# Patient Record
Sex: Female | Born: 1955 | Race: Asian | Hispanic: No | Marital: Married | Smoking: Never smoker
Health system: Southern US, Community
[De-identification: ages and names within clinical notes are randomized; demographics above are authoritative.]

## PROBLEM LIST (undated history)

## (undated) DIAGNOSIS — K219 Gastro-esophageal reflux disease without esophagitis: Secondary | ICD-10-CM

## (undated) DIAGNOSIS — E785 Hyperlipidemia, unspecified: Secondary | ICD-10-CM

---

## 1996-12-09 HISTORY — PX: APPENDECTOMY: SHX54

## 2009-05-10 ENCOUNTER — Ambulatory Visit: Payer: Self-pay | Admitting: Diagnostic Radiology

## 2009-05-10 ENCOUNTER — Emergency Department (HOSPITAL_BASED_OUTPATIENT_CLINIC_OR_DEPARTMENT_OTHER): Admission: EM | Admit: 2009-05-10 | Discharge: 2009-05-10 | Payer: Self-pay | Admitting: Emergency Medicine

## 2011-03-18 LAB — URINALYSIS, ROUTINE W REFLEX MICROSCOPIC
Glucose, UA: NEGATIVE mg/dL
Protein, ur: NEGATIVE mg/dL
Specific Gravity, Urine: 1.005 (ref 1.005–1.030)
pH: 7 (ref 5.0–8.0)

## 2011-03-18 LAB — URINE MICROSCOPIC-ADD ON

## 2011-03-18 LAB — CBC
Hemoglobin: 14.6 g/dL (ref 12.0–15.0)
MCHC: 34.8 g/dL (ref 30.0–36.0)
RDW: 11.8 % (ref 11.5–15.5)

## 2011-03-18 LAB — COMPREHENSIVE METABOLIC PANEL
ALT: 11 U/L (ref 0–35)
Calcium: 9.7 mg/dL (ref 8.4–10.5)
GFR calc Af Amer: >60 mL/min (ref >60)
Glucose, Bld: 101 mg/dL — ABNORMAL HIGH (ref 70–99)
Sodium: 146 mEq/L — ABNORMAL HIGH (ref 135–145)
Total Protein: 9 g/dL — ABNORMAL HIGH (ref 6.0–8.3)

## 2011-03-18 LAB — DIFFERENTIAL
Eosinophils Absolute: 0.1 10*3/uL (ref 0.0–0.7)
Lymphocytes Relative: 39 % (ref 12–46)
Lymphs Abs: 1.8 10*3/uL (ref 0.7–4.0)
Monocytes Relative: 5 % (ref 3–12)
Neutrophils Relative %: 54 % (ref 43–77)

## 2011-03-18 LAB — POCT CARDIAC MARKERS
CKMB, poc: <1 ng/mL — ABNORMAL LOW (ref 1.0–8.0)
Troponin i, poc: <0.05 ng/mL (ref 0.00–0.09)

## 2015-10-11 ENCOUNTER — Ambulatory Visit: Payer: BC Managed Care – PPO | Attending: Physician Assistant

## 2015-10-11 DIAGNOSIS — M25659 Stiffness of unspecified hip, not elsewhere classified: Secondary | ICD-10-CM | POA: Diagnosis present

## 2015-10-11 DIAGNOSIS — M5441 Lumbago with sciatica, right side: Secondary | ICD-10-CM | POA: Insufficient documentation

## 2015-10-11 NOTE — Patient Instructions (Signed)
Perform all exercises below:  Hold _20___ seconds. Repeat _3___ times.  Do __3__ sessions per day. CAUTION: Movement should be gentle, steady and slow.  Knee to Chest  Lying supine, bend involved knee to chest. Perform with each leg.  Copyright  VHI. All rights reserved.  Double Knee to Chest (Flexion)   Gently pull both knees toward chest. Feel stretch in lower back or buttock area. Breathing deeply, Lumbar Rotation: Caudal - Bilateral (Supine)  Feet and knees together, arms outstretched, rotate knees left, turning head in opposite direction, until stretch is felt.      HIP: Hamstrings - Short Sitting   Rest leg on raised surface. Keep knee straight. Lift chest.   [facility] [address], [address] [place], [place] [address] Phone # [phone] Fax [phone] 

## 2015-10-11 NOTE — Therapy (Signed)
[facility] [address] [place], [place], [address] Phone: [phone]   Fax:  [phone]  Physical Therapy Evaluation  Patient Details  Name: Brenda Ortega MRN: [id number] Date of Birth: Jun 27, 1956 Referring Provider: Manon HildingMauney, Jessica PA  Encounter Date: 10/11/2015      PT End of Session - 10/11/15 1610    Visit Number 1   Date for PT Re-Evaluation 12/06/15   PT Start Time 1534   PT Stop Time 1610   PT Time Calculation (min) 36 min   Activity Tolerance Patient tolerated treatment well   Behavior During Therapy [facility] for tasks assessed/performed      History reviewed. No pertinent past medical history.  History reviewed. No pertinent past surgical history.  There were no vitals filed for this visit.  Visit Diagnosis:  Bilateral low back pain with right-sided sciatica - Plan: PT plan of care cert/re-cert  Hip stiffness, unspecified laterality - Plan: PT plan of care cert/re-cert      Subjective Assessment - 10/11/15 1535    Subjective Pt presents to PT with complaints of LBP that began 2009.  Pt stopped working 2011 due to LBP/Rt LE pain.  Pt had injections into lumbar spine due to Rt LE pain.  Pt has not had therapy in the past.  2 months ago, pain began in low back again.     Patient is accompained by: Interpreter   Limitations Sitting;Walking   How long can you sit comfortably? 1 hour   How long can you walk comfortably? 1 hour   Patient Stated Goals reduce low back pain, stand and walk longer, less pain with steps   Currently in Pain? Yes   Pain Score 5    Pain Location Back   Pain Orientation Right   Pain Descriptors / Indicators Stabbing   Pain Type Chronic pain   Pain Radiating Towards Rt LE radiating to Rt heel   Pain Onset More than a month ago   Pain Frequency Constant   Aggravating Factors  standing, sitting, negotiating steps   Pain Relieving Factors medication            [facility] PT  Assessment - 10/11/15 0001    Assessment   Medical Diagnosis LBP (M54.5)   Referring Provider Manon HildingMauney, Jessica PA   Onset Date/Surgical Date 08/11/15  chronic history with flare-up 2 months ago   Next MD Visit unknown   Prior Therapy none   Precautions   Precautions None   Restrictions   Weight Bearing Restrictions No   Balance Screen   Has the patient fallen in the past 6 months No   Has the patient had a decrease in activity level because of a fear of falling?  No   Is the patient reluctant to leave their home because of a fear of falling?  No   Home Environment   Living Environment Private residence   Type of Home House   Home Access Stairs to enter   Entrance Stairs-Number of Steps 3   Home Layout One level   Prior Function   Level of Independence Independent   Vocation Unemployed   Leisure pt has a stationary bike at home   Cognition   Overall Cognitive Status Within Functional Limits for tasks assessed   Observation/Other Assessments   Focus on Therapeutic Outcomes (FOTO)  41% limitation   Posture/Postural Control   Posture/Postural Control Postural limitations   Postural Limitations --  Rt iliac crest higher  ROM / Strength   AROM / PROM / Strength AROM;PROM;Strength   AROM   Overall AROM  Within functional limits for tasks performed   Overall AROM Comments Lumbar AROM is full.  Pt reports Rt LE pain with Rt lumbar sidebending.  Mild lumbar pain with lumbar AROM at end range.   PROM   Overall PROM  Deficits   Overall PROM Comments Hip flexibility limited by 25% in all directions without pain   Strength   Overall Strength Deficits   Overall Strength Comments Rt LE strength 4/5 to 4+/5, Lt LE 4+/5 to 5/5 throughout   Palpation   Spinal mobility reduced spinal mobility with PA glides T10-L4 without pain   Palpation comment Pt with tension and mild palpable tenderness over bilateral lumbar paraspinals.     Special Tests    Special Tests Lumbar   Lumbar Tests  Slump Test;Straight Leg Raise   Slump test   Findings Negative   Side Right   Straight Leg Raise   Findings Negative   Side  Right   Ambulation/Gait   Ambulation/Gait Yes   Ambulation/Gait Assistance 7: Independent                           PT Education - 10/11/15 1601    Education provided Yes   Education Details HEP: lumbar flexibility    Person(s) Educated Patient   Methods Explanation;Demonstration;Tactile cues;Handout   Comprehension Verbalized understanding;Returned demonstration          PT Short Term Goals - 10/11/15 1616    PT SHORT TERM GOAL #1   Title be indepenent in initial HEP   Time 4   Period Weeks   Status New   PT SHORT TERM GOAL #2   Title report a 30% reduction in Rt LE pain and LBP with home tasks   Time 4   Period Weeks   Status New           PT Long Term Goals - 10/11/15 1531    PT LONG TERM GOAL #1   Title be independent in advanced HEP   Time 8   Period Weeks   Status New   PT LONG TERM GOAL #2   Title reduce FOTO to < or = to 29% limitation   Time 8   Period Weeks   Status New   PT LONG TERM GOAL #3   Title report a 60% reduction in LBP and Rt LE pain with home tasks   Time 8   Period Weeks   Status New   PT LONG TERM GOAL #4   Title negotiate steps with 50% less Rt LE pain   Time 8   Period Weeks   Status New               Plan - 10/11/15 1611    Clinical Impression Statement Pt presents to PT with chronic LBP and Rt LE radiculopathy.  Pt reports that she was told in 2009 that she needed lumbar surgery but didn't want to have surgery.  She stopped working in 2011 due to LBP.  Pt reports that LBP increased 2 months ago without cause.  Pt demonstrates limited hip flexibility, Rt LE pain with Rt lumbar sidebending, LE weakness, FOTO score of 41% limitation.  Pt will benefit from skilled PT for hip flexibility, core strength, LE strength, modalities and trial of traction to reduce pain.     Pt will  benefit  from skilled therapeutic intervention in order to improve on the following deficits Postural dysfunction;Decreased strength;Impaired flexibility;Pain;Decreased activity tolerance;Decreased range of motion   Rehab Potential Good   PT Frequency 2x / week   PT Duration 8 weeks   PT Treatment/Interventions ADLs/Self Care Home Management;Cryotherapy;Electrical Stimulation;Moist Heat;Therapeutic exercise;Therapeutic activities;Functional mobility training;Traction;Ultrasound;Neuromuscular re-education;Patient/family education;Manual techniques;Passive range of motion;Taping   PT Next Visit Plan body mechanics, hip flexibility, LE strength, try traction, modalities for pain management   Consulted and Agree with Plan of Care Patient         Problem List There are no active problems to display for this patient.   Rorik Vespa, PT 10/11/2015, 4:19 PM  [facility] [address], [address] [place], [place], [address] Phone: [phone]   Fax:  [phone]  Name: Brenda Ortega MRN: [id number] Date of Birth: 01/26/1956

## 2015-10-18 ENCOUNTER — Ambulatory Visit: Payer: BC Managed Care – PPO | Admitting: Physical Therapy

## 2015-10-18 ENCOUNTER — Encounter: Payer: Self-pay | Admitting: Physical Therapy

## 2015-10-18 DIAGNOSIS — M5441 Lumbago with sciatica, right side: Secondary | ICD-10-CM

## 2015-10-18 DIAGNOSIS — M25659 Stiffness of unspecified hip, not elsewhere classified: Secondary | ICD-10-CM

## 2015-10-18 NOTE — Patient Instructions (Signed)
Lifting Principles  .Maintain proper posture and head alignment. .Slide object as close as possible before lifting. .Move obstacles out of the way. .Test before lifting; ask for help if too heavy. .Tighten stomach muscles without holding breath. .Use smooth movements; do not jerk. .Use legs to do the work, and pivot with feet. .Distribute the work load symmetrically and close to the center of trunk. .Push instead of pull whenever possible.  Copyright  VHI. All rights reserved.  Posture - Sitting    Sit upright, head facing forward. Try using a roll to support lower back. Keep shoulders relaxed, and avoid rounded back. Keep hips level with knees. Avoid crossing legs for long periods.   Copyright  VHI. All rights reserved.  Deep Squat    Squat and lift with both arms held against upper trunk. Tighten stomach muscles without holding breath. Use smooth movements to avoid jerking.  Copyright  VHI. All rights reserved.  Housework - Sweeping    Use long-handled equipment to avoid stooping.   Copyright  VHI. All rights reserved.  Sleeping on Back    Place pillow under knees. A pillow with cervical support and a roll around waist are also helpful.   Copyright  VHI. All rights reserved.  Sleeping on Side    Place pillow between knees. Use cervical support under neck and a roll around waist as needed.   Copyright  VHI. All rights reserved.  Chair Sitting    Sit at edge of seat, spine straight, one leg extended. Put a hand on each thigh and bend forward from the hip, keeping spine straight. Allow hand on extended leg to reach toward toes. Support upper body with other arm. Hold _30__ seconds. Repeat _2__ times per session. Do _1__ sessions per day.  Copyright  VHI. All rights reserved.  Piriformis Stretch, Sitting    Sit, one ankle on opposite knee, same-side hand on crossed knee. Push down on knee, keeping spine straight. Lean torso forward, with flat  back, until tension is felt in hamstrings and gluteals of crossed-leg side. Hold _30__ seconds.  Repeat _1__ times per session. Do _2__ sessions per day.  Copyright  VHI. All rights reserved.  [facility] [address], [address] [place], [place] [address] Phone # [phone] Fax [phone]

## 2015-10-18 NOTE — Therapy (Signed)
[facility] [address], [address] [place], [place], [address] Phone: [phone]   Fax:  [phone]  Physical Therapy Treatment  Patient Details  Name: Bettye Boeckwe Dileo MRN: [id number] Date of Birth: Jun 02, 1956 Referring Provider: Manon HildingMauney, Jessica PA  Encounter Date: 10/18/2015      PT End of Session - 10/18/15 1606    Visit Number 2   Date for PT Re-Evaluation 12/06/15   PT Start Time 1530   PT Stop Time 1615   PT Time Calculation (min) 45 min   Activity Tolerance Patient tolerated treatment well   Behavior During Therapy [facility] for tasks assessed/performed      History reviewed. No pertinent past medical history.  History reviewed. No pertinent past surgical history.  There were no vitals filed for this visit.  Visit Diagnosis:  Bilateral low back pain with right-sided sciatica  Hip stiffness, unspecified laterality      Subjective Assessment - 10/18/15 1537    Subjective patient reports lumbar pain and into both legs. 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 Does not notice the difference in exercise.    Patient is accompained by: Interpreter   Limitations Sitting;Walking   How long can you sit comfortably? 1 hour   How long can you walk comfortably? 1 hour   Patient Stated Goals reduce low back pain, stand and walk longer, less pain with steps   Currently in Pain? Yes   Pain Score 5    Pain Location Back   Pain Orientation Right;Left   Pain Descriptors / Indicators Stabbing   Pain Type Chronic pain   Pain Radiating Towards bil. radiating pain in both legs   Pain Onset More than a month ago   Pain Frequency Intermittent   Aggravating Factors  walking, working, stairs 3-4,    Pain Relieving Factors medication   Multiple Pain Sites No                         [facility] Adult PT Treatment/Exercise - 10/18/15 0001    Therapeutic Activites    Therapeutic Activities Lifting;ADL's   ADL's sitting, sleeping posture; sweeping   Lifting lifting items   Lumbar Exercises: Stretches   Active Hamstring Stretch 2 reps;30 seconds  sitting   Piriformis Stretch 2 reps;30 seconds  sitting   Modalities   Modalities Traction   Traction   Type of Traction Lumbar   Min (lbs) 25   Max (lbs) 70   Time 17 min                PT Education - 10/18/15 1557    Education provided Yes   Education Details body mechanics with sitting and sleeping, sweeping, lifting; , hamstring and piriformis stretch   Person(s) Educated Patient   Methods Explanation;Demonstration;Verbal cues;Handout   Comprehension Returned demonstration;Verbalized understanding          PT Short Term Goals - 10/18/15 1539    PT SHORT TERM GOAL #1   Title be indepenent in initial HEP   Time 4   Period Weeks   Status On-going  just go exercises   PT SHORT TERM GOAL #2   Title report a 30% reduction in Rt LE pain and LBP with home tasks   Time 4   Period Weeks   Status New  just started therapy           PT Long Term Goals - 10/11/15 1531    PT LONG TERM GOAL #1  Title be independent in advanced HEP   Time 8   Period Weeks   Status New   PT LONG TERM GOAL #2   Title reduce FOTO to < or = to 29% limitation   Time 8   Period Weeks   Status New   PT LONG TERM GOAL #3   Title report a 60% reduction in LBP and Rt LE pain with home tasks   Time 8   Period Weeks   Status New   PT LONG TERM GOAL #4   Title negotiate steps with 50% less Rt LE pain   Time 8   Period Weeks   Status New               Plan - 10/18/15 1600    Clinical Impression Statement Patient is a 59 year old with chronic LBP and Right LE radiculopathy.  Patient just started therapy so she has not made progress to goals.  Patient has started to learn correct body mechanics with tasks and stretches.  Patient is trying lumbar traction.  Paitent has an interpreter while in therapy.  Patient will benefit from skilled PT to imporve hip flexibility , core  strength, lE strength, and modalities.    Pt will benefit from skilled therapeutic intervention in order to improve on the following deficits Postural dysfunction;Decreased strength;Impaired flexibility;Pain;Decreased activity tolerance;Decreased range of motion   Rehab Potential Good   Clinical Impairments Affecting Rehab Potential None   PT Frequency 2x / week   PT Duration 8 weeks   PT Treatment/Interventions ADLs/Self Care Home Management;Cryotherapy;Electrical Stimulation;Moist Heat;Therapeutic exercise;Therapeutic activities;Functional mobility training;Traction;Ultrasound;Neuromuscular re-education;Patient/family education;Manual techniques;Passive range of motion;Taping   PT Next Visit Plan see if lumbar traction helps, modalities as needed, core strength   PT Home Exercise Plan progress as needed   Recommended Other Services None   Consulted and Agree with Plan of Care Patient        Problem List There are no active problems to display for this patient.   Izik Bingman,PT 10/18/2015, 4:07 PM  [facility] [address] [place], [place], [address] Phone: [phone]   Fax:  [phone]  Name: Patirica Longshore MRN: [id number] Date of Birth: April 03, 1956

## 2015-11-06 ENCOUNTER — Ambulatory Visit: Payer: BC Managed Care – PPO | Admitting: Physical Therapy

## 2015-11-06 DIAGNOSIS — M5441 Lumbago with sciatica, right side: Secondary | ICD-10-CM | POA: Diagnosis not present

## 2015-11-06 DIAGNOSIS — M25659 Stiffness of unspecified hip, not elsewhere classified: Secondary | ICD-10-CM

## 2015-11-06 NOTE — Therapy (Signed)
[facility] [address] [place], [place], [address] Phone: [phone]   Fax:  [phone]  Physical Therapy Treatment  Patient Details  Name: Brenda Ortega MRN: [id number] Date of Birth: May 14, 1956 Referring Provider: Manon HildingMauney, Jessica PA  Encounter Date: 11/06/2015      PT End of Session - 11/06/15 1610    Visit Number 3   Date for PT Re-Evaluation 12/06/15   PT Start Time 1530   PT Stop Time 1610   PT Time Calculation (min) 40 min   Activity Tolerance Patient tolerated treatment well   Behavior During Therapy [facility] for tasks assessed/performed      No past medical history on file.  No past surgical history on file.  There were no vitals filed for this visit.  Visit Diagnosis:  Bilateral low back pain with right-sided sciatica  Hip stiffness, unspecified laterality      Subjective Assessment - 11/06/15 1536    Subjective Traction is not helping.     Patient is accompained by: Interpreter   How long can you sit comfortably? 1 hour   How long can you walk comfortably? 1 hour   Patient Stated Goals reduce low back pain, stand and walk longer, less pain with steps   Currently in Pain? Yes   Pain Score 8   low is 5/10   Pain Location Back   Pain Orientation Right   Pain Descriptors / Indicators Sore   Pain Type Chronic pain   Pain Radiating Towards radiates into left leg   Pain Onset More than a month ago   Pain Frequency Intermittent   Aggravating Factors  work, stairs, walking   Pain Relieving Factors medication   Multiple Pain Sites No            [facility] PT Assessment - 11/06/15 0001    Palpation   SI assessment  right ilium is anteriorly rotated                     [facility] Adult PT Treatment/Exercise - 11/06/15 0001    Manual Therapy   Manual Therapy Joint mobilization;Muscle Energy Technique;Soft tissue mobilization;Neural Stretch   Manual therapy comments manually stretch to right  hip for all directions, manually stretched righ thip flexor   Joint Mobilization P-A mobilization grade 3 L3-L5 ; right hip mobilization grade 3 distraction and posterior glide   Soft tissue mobilization right hamstring,    Muscle Energy Technique correct anteriorly rotated ilium   Neural Stretch to right LE                PT Education - 11/06/15 1609    Education provided Yes   Education Details instructed patient on how to use a rolling pin to massage the right hamstring   Person(s) Educated Patient   Methods Explanation;Demonstration   Comprehension Verbalized understanding;Returned demonstration          PT Short Term Goals - 11/06/15 1610    PT SHORT TERM GOAL #1   Title be indepenent in initial HEP   Time 4   Period Weeks   Status Achieved   PT SHORT TERM GOAL #2   Title report a 30% reduction in Rt LE pain and LBP with home tasks   Time 4   Period Weeks   Status On-going           PT Long Term Goals - 10/11/15 1531    PT LONG TERM GOAL #1  Title be independent in advanced HEP   Time 8   Period Weeks   Status New   PT LONG TERM GOAL #2   Title reduce FOTO to < or = to 29% limitation   Time 8   Period Weeks   Status New   PT LONG TERM GOAL #3   Title report a 60% reduction in LBP and Rt LE pain with home tasks   Time 8   Period Weeks   Status New   PT LONG TERM GOAL #4   Title negotiate steps with 50% less Rt LE pain   Time 8   Period Weeks   Status New               Plan - 11/06/15 1610    Clinical Impression Statement Patient is a 59 year old with chronic LBP and right LE radiculopathy.  Patient had a anteriorly rotated right ilium, tight right hip, tight right hamstring, hip flexor, right piriformis, and decreased mobility of lumbar spine.  After therapy patient pelvis in correct alignment and able to go up and down stairs with on pain.  Patient will benefit form phsyical therapy to reduce pain and increase tissue mobility.    Pt  will benefit from skilled therapeutic intervention in order to improve on the following deficits Postural dysfunction;Decreased strength;Impaired flexibility;Pain;Decreased activity tolerance;Decreased range of motion   Rehab Potential Good   Clinical Impairments Affecting Rehab Potential None   PT Frequency 2x / week   PT Duration 8 weeks   PT Treatment/Interventions ADLs/Self Care Home Management;Cryotherapy;Electrical Stimulation;Moist Heat;Therapeutic exercise;Therapeutic activities;Functional mobility training;Traction;Ultrasound;Neuromuscular re-education;Patient/family education;Manual techniques;Passive range of motion;Taping   PT Next Visit Plan soft tissue work to right hamstring and gluteal, neurotension stretch to right LE, check pelvic alignment. No lumbar traction   PT Home Exercise Plan hamstring, quads, and hip adductor stretches   Consulted and Agree with Plan of Care Patient        Problem List There are no active problems to display for this patient.   GRAY,CHERYL,PT 11/06/2015, 4:16 PM  [facility] [address] [place], [place], [address] Phone: [phone]   Fax:  [phone]  Name: Brenda Ortega MRN: [id number] Date of Birth: [birth year]

## 2015-11-08 ENCOUNTER — Ambulatory Visit: Payer: BC Managed Care – PPO | Admitting: Physical Therapy

## 2015-11-08 ENCOUNTER — Encounter: Payer: Self-pay | Admitting: Physical Therapy

## 2015-11-08 DIAGNOSIS — M5441 Lumbago with sciatica, right side: Secondary | ICD-10-CM

## 2015-11-08 DIAGNOSIS — M25659 Stiffness of unspecified hip, not elsewhere classified: Secondary | ICD-10-CM

## 2015-11-08 NOTE — Therapy (Signed)
[facility] [address] [place], [place], [address] Phone: [phone]   Fax:  [phone]  Physical Therapy Treatment  Patient Details  Name: Brenda Ortega MRN: [id number] Date of Birth: Apr 04, 1956 Referring Provider: Manon Hilding PA  Encounter Date: 11/08/2015      PT End of Session - 11/08/15 1651    Visit Number 4   Date for PT Re-Evaluation 12/06/15   PT Start Time 1613   PT Stop Time 1700   PT Time Calculation (min) 47 min   Activity Tolerance Patient tolerated treatment well      History reviewed. No pertinent past medical history.  History reviewed. No pertinent past surgical history.  There were no vitals filed for this visit.  Visit Diagnosis:  Bilateral low back pain with right-sided sciatica  Hip stiffness, unspecified laterality      Subjective Assessment - 11/08/15 1617    Subjective An hour ago pain wqas 9/10, currently she has no pain.   I feel better when I exercise.    Patient is accompained by: Interpreter   Currently in Pain? No/denies   Multiple Pain Sites No                         [facility] Adult PT Treatment/Exercise - 11/08/15 0001    Lumbar Exercises: Stretches   Active Hamstring Stretch 3 reps;30 seconds   Single Knee to Chest Stretch 3 reps;30 seconds   Piriformis Stretch 3 reps;30 seconds   Lumbar Exercises: Aerobic   Stationary Bike Nustep L1 x 5 min   Lumbar Exercises: Supine   Bridge 10 reps                PT Education - 11/08/15 1630    Education provided Yes   Education Details Posture snd body mechanics   Person(s) Educated Patient;Spouse   Methods Explanation;Demonstration;Tactile cues;Verbal cues;Handout   Comprehension Verbalized understanding;Returned demonstration          PT Short Term Goals - 11/06/15 1610    PT SHORT TERM GOAL #1   Title be indepenent in initial HEP   Time 4   Period Weeks   Status Achieved   PT SHORT  TERM GOAL #2   Title report a 30% reduction in Rt LE pain and LBP with home tasks   Time 4   Period Weeks   Status On-going           PT Long Term Goals - 10/11/15 1531    PT LONG TERM GOAL #1   Title be independent in advanced HEP   Time 8   Period Weeks   Status New   PT LONG TERM GOAL #2   Title reduce FOTO to < or = to 29% limitation   Time 8   Period Weeks   Status New   PT LONG TERM GOAL #3   Title report a 60% reduction in LBP and Rt LE pain with home tasks   Time 8   Period Weeks   Status New   PT LONG TERM GOAL #4   Title negotiate steps with 50% less Rt LE pain   Time 8   Period Weeks   Status New               Plan - 11/08/15 1641    Clinical Impression Statement Pain seems to be unchanged: intermittent in nature and with very intense periods followed by no pain. Pt reports  when she sneezes or coughs it sends pain into her back.    Pt will benefit from skilled therapeutic intervention in order to improve on the following deficits Postural dysfunction;Decreased strength;Impaired flexibility;Pain;Decreased activity tolerance;Decreased range of motion   Rehab Potential Good   Clinical Impairments Affecting Rehab Potential None   PT Frequency 2x / week   PT Duration 8 weeks   PT Treatment/Interventions ADLs/Self Care Home Management;Cryotherapy;Electrical Stimulation;Moist Heat;Therapeutic exercise;Therapeutic activities;Functional mobility training;Traction;Ultrasound;Neuromuscular re-education;Patient/family education;Manual techniques;Passive range of motion;Taping   PT Next Visit Plan Hip flexibility core strength   Consulted and Agree with Plan of Care Patient        Problem List There are no active problems to display for this patient.   COCHRAN,JENNIFER, PTA 11/08/2015, 4:54 PM  [facility] [address], [address] [place], [place], [address] Phone: [phone]   Fax:   [phone]  Name: Bettye Boeckwe Cathers MRN: [id number] Date of Birth: [birth year]

## 2015-11-08 NOTE — Patient Instructions (Signed)

## 2015-11-13 ENCOUNTER — Encounter: Payer: Self-pay | Admitting: Physical Therapy

## 2015-11-13 ENCOUNTER — Ambulatory Visit: Payer: BC Managed Care – PPO | Attending: Physician Assistant | Admitting: Physical Therapy

## 2015-11-13 DIAGNOSIS — M5441 Lumbago with sciatica, right side: Secondary | ICD-10-CM | POA: Insufficient documentation

## 2015-11-13 DIAGNOSIS — M25659 Stiffness of unspecified hip, not elsewhere classified: Secondary | ICD-10-CM | POA: Diagnosis present

## 2015-11-13 NOTE — Therapy (Signed)
[facility] [address], [address] [place], [place], [address] Phone: [phone]   Fax:  [phone]  Physical Therapy Treatment  Patient Details  Name: Brenda Ortega MRN: [id number] Date of Birth: 23-Aug-1956 Referring Provider: Manon HildingMauney, Jessica PA  Encounter Date: 11/13/2015      PT End of Session - 11/13/15 1655    Visit Number 5   Date for PT Re-Evaluation 12/06/15   PT Start Time 1615   PT Stop Time 1655   PT Time Calculation (min) 40 min   Activity Tolerance Patient tolerated treatment well   Behavior During Therapy [facility] for tasks assessed/performed      History reviewed. No pertinent past medical history.  History reviewed. No pertinent past surgical history.  There were no vitals filed for this visit.  Visit Diagnosis:  Bilateral low back pain with right-sided sciatica  Hip stiffness, unspecified laterality      Subjective Assessment - 11/13/15 1617    Subjective Patient reports her pain is overall is 50% better. When pain has difficulty putting weight into leg. Patient reports she is able to walk freer.    Patient is accompained by: Interpreter   Limitations Sitting;Walking   How long can you sit comfortably? 1 hour   How long can you walk comfortably? 1 hour   Patient Stated Goals reduce low back pain, stand and walk longer, less pain with steps   Currently in Pain? Yes   Pain Score 10-Worst pain ever   Pain Location Back   Pain Orientation Right   Pain Descriptors / Indicators Sore   Pain Type Chronic pain   Pain Radiating Towards radiates into leg   Pain Onset More than a month ago   Aggravating Factors  sitting to go into standing   Pain Relieving Factors walking            [facility] PT Assessment - 11/13/15 0001    Strength   Overall Strength Comments right knee 4/5, right hip flexion 5/5,right hip ext 4/5   Palpation   SI assessment  right ilium is anteriorly rotated                      [facility] Adult PT Treatment/Exercise - 11/13/15 0001    Lumbar Exercises: Stretches   Active Hamstring Stretch 3 reps;30 seconds   Piriformis Stretch --  sit on ball and roll on piriformis   Lumbar Exercises: Standing   Wall Slides 5 reps  10 sec   Other Standing Lumbar Exercises step up on 4" step 20 x no pain; right calf stretch hold 30 sec 2x   Other Standing Lumbar Exercises go up and down stairs with no  pain; side step with red band  around thighs 40 feet   Lumbar Exercises: Seated   Sit to Stand 10 reps  with increased hip flexion, abdominal bracing   Manual Therapy   Manual Therapy Muscle Energy Technique;Soft tissue mobilization   Manual therapy comments manually stretched bil. hip rotators   Soft tissue mobilization right piriformis   Muscle Energy Technique correct anteriorly rotated ilium                PT Education - 11/13/15 1654    Education provided Yes   Education Details using tennis ball to sit on and massage right piriformis   Person(s) Educated Patient   Methods Explanation   Comprehension Verbalized understanding;Returned demonstration          PT  Short Term Goals - 11/13/15 1659    PT SHORT TERM GOAL #1   Title be indepenent in initial HEP   Time 4   Period Weeks   Status Achieved   PT SHORT TERM GOAL #2   Title report a 30% reduction in Rt LE pain and LBP with home tasks   Time 4   Period Weeks   Status Achieved           PT Long Term Goals - 10/11/15 1531    PT LONG TERM GOAL #1   Title be independent in advanced HEP   Time 8   Period Weeks   Status New   PT LONG TERM GOAL #2   Title reduce FOTO to < or = to 29% limitation   Time 8   Period Weeks   Status New   PT LONG TERM GOAL #3   Title report a 60% reduction in LBP and Rt LE pain with home tasks   Time 8   Period Weeks   Status New   PT LONG TERM GOAL #4   Title negotiate steps with 50% less Rt LE pain   Time 8   Period Weeks    Status New               Plan - 11/13/15 1656    Clinical Impression Statement Patient is a 59 year old female with chronic LBP and right LE radiculopathy.  Patient reports her pain is 50% better.  Patient continues to have weakness in hip and right knee is 5/5.  Pelvis is rotated anteriorly.  Patient has learned how to do self soft tissue work to right piriformis with tennis ball.  Patient  able to negotiate steps with less pain and not as often. Patient will benefit from physical therapy to reduce pain and increase core strength.    Pt will benefit from skilled therapeutic intervention in order to improve on the following deficits Postural dysfunction;Decreased strength;Impaired flexibility;Pain;Decreased activity tolerance;Decreased range of motion   Rehab Potential Good   Clinical Impairments Affecting Rehab Potential None   PT Frequency 2x / week   PT Duration 8 weeks   PT Treatment/Interventions ADLs/Self Care Home Management;Cryotherapy;Electrical Stimulation;Moist Heat;Therapeutic exercise;Therapeutic activities;Functional mobility training;Traction;Ultrasound;Neuromuscular re-education;Patient/family education;Manual techniques;Passive range of motion;Taping   PT Next Visit Plan Hip flexibility core strength, soft tissue work to right pirifomis, see if pelvis in correct alignment   PT Home Exercise Plan progress as needed   Consulted and Agree with Plan of Care Patient        Problem List There are no active problems to display for this patient.   GRAY,CHERYL,PT 11/13/2015, 5:00 PM  [facility] [address], [address] [place], [place], [address] Phone: [phone]   Fax:  [phone]  Name: Brenda Ortega MRN: [id number] Date of Birth: Dec 21, 1955

## 2015-11-15 ENCOUNTER — Encounter: Payer: Self-pay | Admitting: Physical Therapy

## 2015-11-15 ENCOUNTER — Ambulatory Visit: Payer: BC Managed Care – PPO | Admitting: Physical Therapy

## 2015-11-15 DIAGNOSIS — M5441 Lumbago with sciatica, right side: Secondary | ICD-10-CM

## 2015-11-15 DIAGNOSIS — M25659 Stiffness of unspecified hip, not elsewhere classified: Secondary | ICD-10-CM

## 2015-11-15 NOTE — Therapy (Signed)
[facility] [address] [place], [place], [address] Phone: [phone]   Fax:  [phone]  Physical Therapy Treatment  Patient Details  Name: Brenda Ortega MRN: [id number] Date of Birth: 25-Jun-1956 Referring Provider: Manon Hilding PA  Encounter Date: 11/15/2015      PT End of Session - 11/15/15 1612    Visit Number 6   Date for PT Re-Evaluation 12/06/15   PT Start Time 1612   PT Stop Time 1700   PT Time Calculation (min) 48 min   Activity Tolerance Patient limited by pain   Behavior During Therapy [facility] for tasks assessed/performed      History reviewed. No pertinent past medical history.  History reviewed. No pertinent past surgical history.  There were no vitals filed for this visit.  Visit Diagnosis:  Bilateral low back pain with right-sided sciatica  Hip stiffness, unspecified laterality      Subjective Assessment - 11/15/15 1615    Subjective Pt hurting a lot oday, reports her RT leg is painful especially in the lateral calf and posterior calf. She reports not doing too much that would aggrevate it.    Pain Score 10-Worst pain ever   Pain Location Leg   Pain Orientation Right   Pain Descriptors / Indicators Sharp;Sore   Aggravating Factors  Not sure    Pain Relieving Factors Not much   Multiple Pain Sites No                         [facility] Adult PT Treatment/Exercise - 11/15/15 0001    Lumbar Exercises: Stretches   Single Knee to Chest Stretch 3 reps;20 seconds   Piriformis Stretch 3 reps;10 seconds  Pain increased in lateral leg   Lumbar Exercises: Aerobic   Stationary Bike Nustep L1 x    Lumbar Exercises: Supine   Bridge 10 reps;2 seconds   Other Supine Lumbar Exercises Decompression in hooklying x 3 min for status review   Moist Heat Therapy   Number Minutes Moist Heat 15 Minutes   Moist Heat Location --  RT lumbar and lateral RT knee and calf   Electrical Stimulation   Electrical Stimulation Location RT lumbar and lateral knee into calf   Electrical Stimulation Action IFC   Electrical Stimulation Goals Pain   Manual Therapy   Manual Therapy --  Long axis leg pull 4 x 10sec                  PT Short Term Goals - 11/13/15 1659    PT SHORT TERM GOAL #1   Title be indepenent in initial HEP   Time 4   Period Weeks   Status Achieved   PT SHORT TERM GOAL #2   Title report a 30% reduction in Rt LE pain and LBP with home tasks   Time 4   Period Weeks   Status Achieved           PT Long Term Goals - 10/11/15 1531    PT LONG TERM GOAL #1   Title be independent in advanced HEP   Time 8   Period Weeks   Status New   PT LONG TERM GOAL #2   Title reduce FOTO to < or = to 29% limitation   Time 8   Period Weeks   Status New   PT LONG TERM GOAL #3   Title report a 60% reduction in LBP and Rt LE pain with  home tasks   Time 8   Period Weeks   Status New   PT LONG TERM GOAL #4   Title negotiate steps with 50% less Rt LE pain   Time 8   Period Weeks   Status New               Plan - 11/15/15 1623    Clinical Impression Statement Unsure about pelvis bc she was so tender at the RT ASIS PTA could not palpate long enough. Pt reports her leg pain is very high today, Pain extends down lateral knee to calf. She reports she plans to contact MD at end of next week.    Pt will benefit from skilled therapeutic intervention in order to improve on the following deficits Postural dysfunction;Decreased strength;Impaired flexibility;Pain;Decreased activity tolerance;Decreased range of motion   Rehab Potential Good   Clinical Impairments Affecting Rehab Potential None   PT Frequency 2x / week   PT Duration 8 weeks   PT Treatment/Interventions ADLs/Self Care Home Management;Cryotherapy;Electrical Stimulation;Moist Heat;Therapeutic exercise;Therapeutic activities;Functional mobility training;Traction;Ultrasound;Neuromuscular  re-education;Patient/family education;Manual techniques;Passive range of motion;Taping   PT Next Visit Plan Check pelvis, assess pain. Go from there.    Consulted and Agree with Plan of Care Patient        Problem List There are no active problems to display for this patient.   Ruble Buttler, PTA 11/15/2015, 4:53 PM  [facility] [address], [address] [place], [place], [address] Phone: [phone]   Fax:  [phone]  Name: Brenda Ortega MRN: [id number] Date of Birth: 1956-05-30   Pt with antalgic gait.

## 2015-11-20 ENCOUNTER — Encounter: Payer: BC Managed Care – PPO | Admitting: Physical Therapy

## 2015-11-22 ENCOUNTER — Ambulatory Visit: Payer: BC Managed Care – PPO | Admitting: Physical Therapy

## 2015-11-22 ENCOUNTER — Encounter: Payer: Self-pay | Admitting: Physical Therapy

## 2015-11-22 DIAGNOSIS — M5441 Lumbago with sciatica, right side: Secondary | ICD-10-CM

## 2015-11-22 DIAGNOSIS — M25659 Stiffness of unspecified hip, not elsewhere classified: Secondary | ICD-10-CM

## 2015-11-22 NOTE — Therapy (Signed)
[facility] [address], [place], [place], [address] Phone: [phone]   Fax:  [phone]  Physical Therapy Treatment  Patient Details  Name: Brenda Ortega MRN: [id number] Date of Birth: Apr 05, 1956 Referring Provider: Suella Grove PA  Encounter Date: 11/22/2015      PT End of Session - 11/22/15 1652    Visit Number 7   Date for PT Re-Evaluation 12/06/15   PT Start Time 8676   PT Stop Time 1648   PT Time Calculation (min) 33 min   Activity Tolerance Patient limited by pain   Behavior During Therapy [facility] for tasks assessed/performed      History reviewed. No pertinent past medical history.  History reviewed. No pertinent past surgical history.  There were no vitals filed for this visit.  Visit Diagnosis:  Bilateral low back pain with right-sided sciatica  Hip stiffness, unspecified laterality      Subjective Assessment - 11/22/15 1617    Subjective I am not better.     Patient is accompained by: Interpreter   Limitations Sitting;Walking   How long can you sit comfortably? 1 hour   How long can you walk comfortably? 1 hour   Patient Stated Goals reduce low back pain, stand and walk longer, less pain with steps   Currently in Pain? Yes   Pain Score 10-Worst pain ever  low is 5/10   Pain Location Buttocks  right leg   Pain Orientation Right   Pain Descriptors / Indicators Discomfort;Sharp;Sore   Pain Type Chronic pain   Pain Radiating Towards radiates into right leg   Pain Onset More than a month ago   Pain Frequency Intermittent   Aggravating Factors  laying down and get up to walk, sitting to standing   Pain Relieving Factors lay on bed,             [facility] PT Assessment - 11/22/15 0001    Assessment   Medical Diagnosis LBP (M54.5)   Referring Provider Suella Grove PA   Onset Date/Surgical Date 08/11/15  chronic history with flare-up 2 months ago   Next MD Visit unknown   Prior Therapy  none   Precautions   Precautions None   Restrictions   Weight Bearing Restrictions No   Balance Screen   Has the patient fallen in the past 6 months No   Has the patient had a decrease in activity level because of a fear of falling?  No   Is the patient reluctant to leave their home because of a fear of falling?  No   Prior Function   Level of Independence Independent   Vocation Unemployed   Leisure pt has a stationary bike at home   Cognition   Overall Cognitive Status Within Functional Limits for tasks assessed   Observation/Other Assessments   Focus on Therapeutic Outcomes (FOTO)  51% limitation   AROM   Overall AROM Comments Lumbar AROM is full.  Pt reports Rt LE pain with Rt lumbar sidebending.  Mild lumbar pain with lumbar AROM at end range.   PROM   Overall PROM Comments Hip flexibility limited by 25% in all directions without pain   Strength   Overall Strength Comments right hip flexion 4/5, knee extension 4/5, knee flexion 4+/5, right ankle dorsiflexion 4/5   Palpation   SI assessment  Right ilium is higher than left   Ambulation/Gait   Ambulation/Gait Yes   Ambulation/Gait Assistance 7: Independent   Gait Pattern Decreased step length -  right;Decreased stance time - right;Decreased weight shift to right                     [facility] Adult PT Treatment/Exercise - 11/22/15 0001    Therapeutic Activites    Therapeutic Activities Lifting   ADL's reviewed with patient on no twisting, no bending, no lifting  heavy objects, keep spinal neutral with activities                PT Education - 11/22/15 1651    Education provided Yes   Education Details verbally reviewed with patient her HEP and patient verbally understands   Person(s) Educated Patient   Methods Explanation   Comprehension Verbalized understanding          PT Short Term Goals - 11/22/15 1633    PT SHORT TERM GOAL #1   Title be indepenent in initial HEP   Time 4   Period Weeks    Status Achieved   PT SHORT TERM GOAL #2   Title report a 30% reduction in Rt LE pain and LBP with home tasks   Time 4   Period Weeks   Status Not Met  11/22/2015 no change in pain           PT Long Term Goals - 11/22/15 1633    PT LONG TERM GOAL #1   Title be independent in advanced HEP   Time 8   Period Weeks   Status Achieved  but hurts her   PT LONG TERM GOAL #3   Title report a 60% reduction in LBP and Rt LE pain with home tasks   Time 8   Period Weeks   Status Not Met  No change   PT LONG TERM GOAL #4   Title negotiate steps with 50% less Rt LE pain   Time 8   Period Weeks   Status Achieved  No change               Plan - 11/22/15 1636    Clinical Impression Statement Patient reports no change in her pain.  It was better for several days in the past but now it is worse and exercises are not helping it. Patient ambulates with a limp on the right.  Patient goes up stairs with pain in right lower extremity.  Patient has weakness in right hip and knee.  FOTO score is 51% limitation compared to 41% limitaiton at initial eval.  Patient had lumbar traction and did not change her symptoms.    Pt will benefit from skilled therapeutic intervention in order to improve on the following deficits Postural dysfunction;Decreased strength;Impaired flexibility;Pain;Decreased activity tolerance;Decreased range of motion   Rehab Potential Good   Clinical Impairments Affecting Rehab Potential None   PT Treatment/Interventions ADLs/Self Care Home Management;Cryotherapy;Electrical Stimulation;Moist Heat;Therapeutic exercise;Therapeutic activities;Functional mobility training;Traction;Ultrasound;Neuromuscular re-education;Patient/family education;Manual techniques;Passive range of motion;Taping   PT Next Visit Plan Discharge to see doctor to be reassessed due to no change in pain   PT Home Exercise Plan current HEP   Consulted and Agree with Plan of Care Patient        Problem  List There are no active problems to display for this patient.   Earlie Counts, PT 11/22/2015 4:55 PM   [facility] [address] [place], [place], [address] Phone: [phone]   Fax:  [phone]  Name: Brenda Ortega MRN: [id number] Date of Birth: 1956/01/16   PHYSICAL THERAPY DISCHARGE SUMMARY  Visits from Start of Care: 7  Current functional level related to goals / functional outcomes: Not met due to no change in pain and exercise increases her pain. See above.   Remaining deficits: See above.   Education / Equipment: HEP  Plan: Patient agrees to discharge.  Patient goals were not met. Patient is being discharged due to lack of progress.Thank  You for the [place], PT 11/22/2015 4:55 PM    ?????

## 2015-11-27 ENCOUNTER — Ambulatory Visit: Payer: BC Managed Care – PPO | Admitting: Physical Therapy

## 2015-11-29 ENCOUNTER — Ambulatory Visit: Payer: BC Managed Care – PPO | Admitting: Physical Therapy

## 2021-05-08 ENCOUNTER — Other Ambulatory Visit: Payer: Self-pay

## 2021-05-08 ENCOUNTER — Emergency Department (HOSPITAL_COMMUNITY)
Admission: EM | Admit: 2021-05-08 | Discharge: 2021-05-08 | Disposition: A | Discharge status: Home / Self Care | Payer: BC Managed Care – PPO | Attending: Emergency Medicine | Admitting: Emergency Medicine

## 2021-05-08 ENCOUNTER — Emergency Department (HOSPITAL_COMMUNITY): Payer: BC Managed Care – PPO

## 2021-05-08 DIAGNOSIS — M542 Cervicalgia: Secondary | ICD-10-CM | POA: Diagnosis not present

## 2021-05-08 DIAGNOSIS — M545 Low back pain, unspecified: Secondary | ICD-10-CM | POA: Diagnosis not present

## 2021-05-08 DIAGNOSIS — M549 Dorsalgia, unspecified: Secondary | ICD-10-CM

## 2021-05-08 LAB — BASIC METABOLIC PANEL
Anion gap: 8 (ref 5–15)
BUN: 10 mg/dL (ref 8–23)
CO2: 25 mmol/L (ref 22–32)
Calcium: 9.1 mg/dL (ref 8.9–10.3)
Chloride: 106 mmol/L (ref 98–111)
Creatinine, Ser: 0.83 mg/dL (ref 0.44–1.00)
GFR, Estimated: >60 mL/min (ref >60)
Glucose, Bld: 93 mg/dL (ref 70–99)
Potassium: 3.3 mmol/L — ABNORMAL LOW (ref 3.5–5.1)
Sodium: 139 mmol/L (ref 135–145)

## 2021-05-08 LAB — CBC
HCT: 47.1 % — ABNORMAL HIGH (ref 36.0–46.0)
Hemoglobin: 15.3 g/dL — ABNORMAL HIGH (ref 12.0–15.0)
MCH: 30.7 pg (ref 26.0–34.0)
MCHC: 32.5 g/dL (ref 30.0–36.0)
MCV: 94.6 fL (ref 80.0–100.0)
Platelets: 354 10*3/uL (ref 150–400)
RBC: 4.98 MIL/uL (ref 3.87–5.11)
RDW: 12.3 % (ref 11.5–15.5)
WBC: 5.3 10*3/uL (ref 4.0–10.5)
nRBC: 0 % (ref 0.0–0.2)

## 2021-05-08 MED ORDER — CYCLOBENZAPRINE HCL 10 MG PO TABS: 10.0000 mg | ORAL_TABLET | Freq: Two times a day (BID) | ORAL | 0 refills | Status: DC | PRN

## 2021-05-08 MED ORDER — IBUPROFEN 400 MG PO TABS: 400.0000 mg | ORAL_TABLET | Freq: Three times a day (TID) | ORAL | 0 refills | Status: DC | PRN

## 2021-05-08 NOTE — Discharge Instructions (Signed)
Take the medications as needed for pain.  Follow-up with your doctor to be rechecked to make sure your symptoms improve

## 2021-05-08 NOTE — ED Triage Notes (Signed)
Patient complains of right lower back pain with radiation down right leg. Denies trauma.

## 2021-05-08 NOTE — ED Notes (Signed)
Patient transported to X-ray 

## 2021-05-08 NOTE — ED Provider Notes (Signed)
[facility] EMERGENCY DEPARTMENT Provider Note   CSN: [id number] Arrival date & time: 05/08/21  1225     History Chief complaint back pain.  Brenda Ortega is a 65 y.o. female.  HPI   Patient states she has been having pain on the right side of her body for years.  Patient states the pain comes and goes.  The pain is sharp and its on the right side of her neck all the way down her back and down into her leg.  Sometimes when she wakes up in the morning she is stiff and she cannot stand up straight.  She is not having any fevers or chills.  No weight loss.  No focal numbness or weakness.  Patient states she has seen her doctor before was instructed to take medications for pain.  No past medical history on file.  There are no problems to display for this patient.   No past surgical history on file.   OB History   No obstetric history on file.     No family history on file.  Social History   Tobacco Use  . Smoking status: Never Smoker  . Smokeless tobacco: Never Used    Home Medications Prior to Admission medications   Medication Sig Start Date End Date Taking? Authorizing Provider  cyclobenzaprine (FLEXERIL) 10 MG tablet Take 1 tablet (10 mg total) by mouth 2 (two) times daily as needed for muscle spasms. 05/08/21  Yes Linwood Dibbles, MD  ibuprofen (ADVIL) 400 MG tablet Take 1 tablet (400 mg total) by mouth every 8 (eight) hours as needed. 05/08/21  Yes Linwood Dibbles, MD    Allergies    Patient has no allergy information on record.  Review of Systems   Review of Systems  All other systems reviewed and are negative.   Physical Exam Updated Vital Signs BP (!) 145/93 (BP Location: Right Arm)   Pulse 66   Temp 98.9 F (37.2 C) (Oral)   Resp 12   SpO2 99%   Physical Exam Vitals and nursing note reviewed.  Constitutional:      General: She is not in acute distress.    Appearance: She is well-developed.  HENT:     Head: Normocephalic and atraumatic.      Right Ear: External ear normal.     Left Ear: External ear normal.  Eyes:     General: No scleral icterus.       Right eye: No discharge.        Left eye: No discharge.     Conjunctiva/sclera: Conjunctivae normal.  Neck:     Trachea: No tracheal deviation.     Comments: Tenderness palpation paraspinal muscle Cardiovascular:     Rate and Rhythm: Normal rate and regular rhythm.  Pulmonary:     Effort: Pulmonary effort is normal. No respiratory distress.     Breath sounds: Normal breath sounds. No stridor. No wheezing or rales.  Abdominal:     General: Bowel sounds are normal. There is no distension.     Palpations: Abdomen is soft.     Tenderness: There is no abdominal tenderness. There is no guarding or rebound.  Musculoskeletal:        General: Tenderness present. No swelling.     Cervical back: Neck supple.     Comments: Tenderness palpation paraspinal muscle region of the neck, tenderness palpation paraspinal muscle in the lumbar region  Skin:    General: Skin is warm and dry.  Findings: No rash.  Neurological:     Mental Status: She is alert.     Cranial Nerves: No cranial nerve deficit (no facial droop, extraocular movements intact, no slurred speech).     Sensory: No sensory deficit.     Motor: No abnormal muscle tone or seizure activity.     Coordination: Coordination normal.     ED Results / Procedures / Treatments   Labs (all labs ordered are listed, but only abnormal results are displayed) Labs Reviewed  CBC - Abnormal; Notable for the following components:      Result Value   Hemoglobin 15.3 (*)    HCT 47.1 (*)    All other components within normal limits  BASIC METABOLIC PANEL - Abnormal; Notable for the following components:   Potassium 3.3 (*)    All other components within normal limits    EKG None  Radiology DG Cervical Spine Complete  Result Date: 05/08/2021 CLINICAL DATA:  Neck pain EXAM: CERVICAL SPINE - COMPLETE 4+ VIEW COMPARISON:  None.  FINDINGS: Seven cervical segments are well visualized. Vertebral body height is well maintained. Osteophytic changes are noted particularly at C5-6 and C6-7. Mild facet hypertrophic changes are noted. Neural foramina are patent bilaterally with the exception of mild narrowing at C5-6 and C6-7. The odontoid is within normal limits. No soft tissue abnormality is seen. IMPRESSION: Degenerative change without acute abnormality. Electronically Signed   By: Alcide Clever M.D.   On: 05/08/2021 17:51   DG Lumbar Spine Complete  Result Date: 05/08/2021 CLINICAL DATA:  Back pain radiating into the right leg, initial encounter EXAM: LUMBAR SPINE - COMPLETE 4+ VIEW COMPARISON:  None. FINDINGS: Five lumbar type vertebral bodies are well visualized. Vertebral body height is well maintained. No pars defects are noted. No anterolisthesis is seen. Mild osteophytic changes are noted. Multilevel disc space narrowing is noted throughout the lumbar spine. No soft tissue abnormality is noted. Increased sclerosis is noted in the left femoral head likely related to avascular necrosis. IMPRESSION: Degenerative change without acute abnormality. 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 Electronically Signed   By: Alcide Clever M.D.   On: 05/08/2021 17:52    Procedures Procedures   Medications Ordered in ED Medications - No data to display  ED Course  I have reviewed the triage vital signs and the nursing notes.  Pertinent labs & imaging results that were available during my care of the patient were reviewed by me and considered in my medical decision making (see chart for details).  Clinical Course as of 05/08/21 1930  Tue May 08, 2021  1828 Degenerative changes noted on x-ray.  No acute abnormalities [JK]  1907 Laboratory tests are unremarkable [JK]    Clinical Course User Index [JK] Linwood Dibbles, MD   MDM Rules/Calculators/A&P                          No signs of infection.  No signs of acute  neurovascular dysfunction.  Patient does have degenerative changes on x-rays.  It is also possible she may be having some sciatica although the pain in the neck area clearly was not related to that.  Will discharge home with prescription for muscle relaxants NSAIDs.  Discussed outpatient follow-up with PCP to be rechecked Final Clinical Impression(s) / ED Diagnoses Final diagnoses:  Back pain, unspecified back location, unspecified back pain laterality, unspecified chronicity  Neck pain    Rx / DC Orders ED  Discharge Orders         Ordered    cyclobenzaprine (FLEXERIL) 10 MG tablet  2 times daily PRN        05/08/21 1929    ibuprofen (ADVIL) 400 MG tablet  Every 8 hours PRN        05/08/21 1929           Linwood Dibbles, MD 05/08/21 1930

## 2021-05-08 NOTE — ED Provider Notes (Signed)
Emergency Medicine Provider Triage Evaluation Note  Brenda Ortega , a 65 y.o. female  was evaluated in triage.  Pt complains of chronic right sided body pain for "years." Seen initially in 2012 for same and received pain injections. She stopped working which seemed to improve her symptoms. She began working again last year and now she has worsening pain.  Review of Systems  Positive: + chronic pain Negative: - fever, body aches, cough  Physical Exam  BP 139/78 (BP Location: Right Arm)   Pulse 83   Temp 98.9 F (37.2 C) (Oral)   Resp 13   SpO2 98%  Gen:   Awake, no distress   Resp:  Normal effort  MSK:   Moves extremities without difficulty  Other:    Medical Decision Making  Medically screening exam initiated at 1:58 PM.  Appropriate orders placed.  Carl Meints was informed that the remainder of the evaluation will be completed by another provider, this initial triage assessment does not replace that evaluation, and the importance of remaining in the ED until their evaluation is complete.  Stable for waiting room. Has been seen in the past for bilateral low back pain in 2016. Limited exam with interpretor however pain seems to radiate from back throughout entire body only on right side. Do not feel she requires labs at this time.    Tanda Rockers, PA-C 05/08/21 1400    Jacalyn Lefevre, MD 05/08/21 [phone]

## 2021-05-11 ENCOUNTER — Other Ambulatory Visit: Payer: Self-pay | Admitting: Internal Medicine

## 2021-05-11 ENCOUNTER — Other Ambulatory Visit (HOSPITAL_COMMUNITY): Payer: Self-pay | Admitting: Internal Medicine

## 2021-05-11 DIAGNOSIS — M5416 Radiculopathy, lumbar region: Secondary | ICD-10-CM

## 2021-05-14 ENCOUNTER — Other Ambulatory Visit: Payer: Self-pay

## 2021-05-14 ENCOUNTER — Ambulatory Visit (HOSPITAL_COMMUNITY)
Admission: RE | Admit: 2021-05-14 | Discharge: 2021-05-14 | Disposition: A | Discharge status: Home / Self Care | Payer: BC Managed Care – PPO | Source: Ambulatory Visit | Attending: Internal Medicine | Admitting: Internal Medicine

## 2021-05-14 DIAGNOSIS — M5416 Radiculopathy, lumbar region: Secondary | ICD-10-CM | POA: Insufficient documentation

## 2021-09-20 ENCOUNTER — Ambulatory Visit: Payer: Self-pay | Admitting: Orthopedic Surgery

## 2021-09-20 DIAGNOSIS — M48062 Spinal stenosis, lumbar region with neurogenic claudication: Secondary | ICD-10-CM

## 2021-10-08 NOTE — Pre-Procedure Instructions (Signed)
Surgical Instructions    Your procedure is scheduled on Thursday 10/11/21.   Report to [facility] Main Entrance "A" at 05:30 A.M., then check in with the Admitting office.  Call this number if you have problems the morning of surgery:  [phone]   If you have any questions prior to your surgery date call [phone]: Open Monday-Friday 8am-4pm    Remember:  Do not eat after midnight the night before your surgery  You may drink clear liquids until 04:30 A.M. the morning of your surgery.   Clear liquids allowed are: Water, Non-Citrus Juices (without pulp), Carbonated Beverages, Clear Tea, Black Coffee ONLY (NO MILK, CREAM OR POWDERED CREAMER of any kind), and Gatorade  Patient Instructions  The night before surgery:  No food after midnight. ONLY clear liquids after midnight  The day of surgery (if you do NOT have diabetes):  Drink ONE (1) Pre-Surgery Clear Ensure by 04:30 A.M. the morning of surgery. Drink in one sitting. Do not sip.  This drink was given to you during your hospital  pre-op appointment visit.  Nothing else to drink after completing the  Pre-Surgery Clear Ensure.        If you have questions, please contact your surgeon's office.     Take these medicines the morning of surgery with A SIP OF WATER   pantoprazole (PROTONIX)     As of today, STOP taking any Aspirin (unless otherwise instructed by your surgeon) Aleve, Naproxen, Ibuprofen, Motrin, Advil, Goody's, BC's, all herbal medications, fish oil, and all vitamins.     After your COVID test   You are not required to quarantine however you are required to wear a well-fitting mask when you are out and around people not in your household.  If your mask becomes wet or soiled, replace with a new one.  Wash your hands often with soap and water for 20 seconds or clean your hands with an alcohol-based hand sanitizer that contains at least 60% alcohol.  Do not share personal items.  Notify your provider: if  you are in close contact with someone who has COVID  or if you develop a fever of 100.4 or greater, sneezing, cough, sore throat, shortness of breath or body aches.             Do not wear jewelry or makeup Do not wear lotions, powders, perfumes/colognes, or deodorant. Do not shave 48 hours prior to surgery.  Men may shave face and neck. Do not bring valuables to the hospital. DO Not wear nail polish, gel polish, artificial nails, or any other type of covering on natural nails including finger and toenails. If patients have artificial nails, gel coating, etc. that need to be removed by a nail salon, please have this removed prior to surgery or surgery may need to be canceled/delayed if the surgeon/ anesthesia feels like the patient is unable to be adequately monitored.             [facility] is not responsible for any belongings or valuables.  Do NOT Smoke (Tobacco/Vaping)  24 hours prior to your procedure  If you use a CPAP at night, you may bring your mask for your overnight stay.   Contacts, glasses, hearing aids, dentures or partials may not be worn into surgery, please bring cases for these belongings   For patients admitted to the hospital, discharge time will be determined by your treatment team.   Patients discharged the day of surgery will not be allowed to  drive home, and someone needs to stay with them for 24 hours.  NO VISITORS WILL BE ALLOWED IN PRE-OP WHERE PATIENTS ARE PREPPED FOR SURGERY.  ONLY 1 SUPPORT PERSON MAY BE PRESENT IN THE WAITING ROOM WHILE YOU ARE IN SURGERY.  IF YOU ARE TO BE ADMITTED, ONCE YOU ARE IN YOUR ROOM YOU WILL BE ALLOWED TWO (2) VISITORS. 1 (ONE) VISITOR MAY STAY OVERNIGHT BUT MUST ARRIVE TO THE ROOM BY 8pm.  Minor children may have two parents present. Special consideration for safety and communication needs will be reviewed on a case by case basis.  Special instructions:    Oral Hygiene is also important to reduce your risk of infection.   Remember - BRUSH YOUR TEETH THE MORNING OF SURGERY WITH YOUR REGULAR TOOTHPASTE   [facility]- Preparing For Surgery  Before surgery, you can play an important role. Because skin is not sterile, your skin needs to be as free of germs as possible. You can reduce the number of germs on your skin by washing with CHG (chlorahexidine gluconate) Soap before surgery.  CHG is an antiseptic cleaner which kills germs and bonds with the skin to continue killing germs even after washing.     Please do not use if you have an allergy to CHG or antibacterial soaps. If your skin becomes reddened/irritated stop using the CHG.  Do not shave (including legs and underarms) for at least 48 hours prior to first CHG shower. It is OK to shave your face.  Please follow these instructions carefully.     Shower the NIGHT BEFORE SURGERY and the MORNING OF SURGERY with CHG Soap.   If you chose to wash your hair, wash your hair first as usual with your normal shampoo. After you shampoo, rinse your hair and body thoroughly to remove the shampoo.  Then [employer] and genitals (private parts) with your normal soap and rinse thoroughly to remove soap.  After that Use CHG Soap as you would any other liquid soap. You can apply CHG directly to the skin and wash gently with a scrungie or a clean washcloth.   Apply the CHG Soap to your body ONLY FROM THE NECK DOWN.  Do not use on open wounds or open sores. Avoid contact with your eyes, ears, mouth and genitals (private parts). Wash Face and genitals (private parts)  with your normal soap.   Wash thoroughly, paying special attention to the area where your surgery will be performed.  Thoroughly rinse your body with warm water from the neck down.  DO NOT shower/wash with your normal soap after using and rinsing off the CHG Soap.  Pat yourself dry with a CLEAN TOWEL.  Wear CLEAN PAJAMAS to bed the night before surgery  Place CLEAN SHEETS on your bed the night before your  surgery  DO NOT SLEEP WITH PETS.   Day of Surgery:  Take a shower with CHG soap. Wear Clean/Comfortable clothing the morning of surgery Do not apply any deodorants/lotions.   Remember to brush your teeth WITH YOUR REGULAR TOOTHPASTE.   Please read over the following fact sheets that you were given.

## 2021-10-09 ENCOUNTER — Other Ambulatory Visit: Payer: Self-pay

## 2021-10-09 ENCOUNTER — Encounter (HOSPITAL_COMMUNITY)
Admission: RE | Admit: 2021-10-09 | Discharge: 2021-10-09 | Disposition: A | Discharge status: Home / Self Care | Payer: BC Managed Care – PPO | Source: Ambulatory Visit | Attending: Orthopedic Surgery | Admitting: Orthopedic Surgery

## 2021-10-09 ENCOUNTER — Encounter (HOSPITAL_COMMUNITY)
Admission: RE | Admit: 2021-10-09 | Discharge: 2021-10-09 | Disposition: A | Discharge status: Home / Self Care | Payer: BC Managed Care – PPO | Source: Ambulatory Visit | Attending: Specialist | Admitting: Specialist

## 2021-10-09 ENCOUNTER — Encounter (HOSPITAL_COMMUNITY): Payer: Self-pay

## 2021-10-09 VITALS — BP 132/77 | HR 88 | Temp 98.6°F | Resp 17 | Ht 62.0 in | Wt 148.0 lb

## 2021-10-09 DIAGNOSIS — Z01812 Encounter for preprocedural laboratory examination: Secondary | ICD-10-CM | POA: Diagnosis not present

## 2021-10-09 DIAGNOSIS — M48062 Spinal stenosis, lumbar region with neurogenic claudication: Secondary | ICD-10-CM

## 2021-10-09 DIAGNOSIS — M5126 Other intervertebral disc displacement, lumbar region: Secondary | ICD-10-CM

## 2021-10-09 DIAGNOSIS — Z20822 Contact with and (suspected) exposure to covid-19: Secondary | ICD-10-CM | POA: Diagnosis not present

## 2021-10-09 DIAGNOSIS — Z01818 Encounter for other preprocedural examination: Secondary | ICD-10-CM

## 2021-10-09 DIAGNOSIS — M4807 Spinal stenosis, lumbosacral region: Secondary | ICD-10-CM | POA: Diagnosis not present

## 2021-10-09 DIAGNOSIS — M879 Osteonecrosis, unspecified: Secondary | ICD-10-CM | POA: Diagnosis not present

## 2021-10-09 LAB — CBC
HCT: 41.4 % (ref 36.0–46.0)
Hemoglobin: 14.1 g/dL (ref 12.0–15.0)
MCH: 31.1 pg (ref 26.0–34.0)
MCHC: 34.1 g/dL (ref 30.0–36.0)
MCV: 91.4 fL (ref 80.0–100.0)
Platelets: 321 10*3/uL (ref 150–400)
RBC: 4.53 MIL/uL (ref 3.87–5.11)
RDW: 12.3 % (ref 11.5–15.5)
WBC: 5.7 10*3/uL (ref 4.0–10.5)
nRBC: 0 % (ref 0.0–0.2)

## 2021-10-09 LAB — BASIC METABOLIC PANEL
Anion gap: 8 (ref 5–15)
BUN: 13 mg/dL (ref 8–23)
CO2: 23 mmol/L (ref 22–32)
Calcium: 8.9 mg/dL (ref 8.9–10.3)
Chloride: 107 mmol/L (ref 98–111)
Creatinine, Ser: 0.74 mg/dL (ref 0.44–1.00)
GFR, Estimated: >60 mL/min (ref >60)
Glucose, Bld: 121 mg/dL — ABNORMAL HIGH (ref 70–99)
Potassium: 3.8 mmol/L (ref 3.5–5.1)
Sodium: 138 mmol/L (ref 135–145)

## 2021-10-09 LAB — SURGICAL PCR SCREEN
MRSA, PCR: NEGATIVE
Staphylococcus aureus: NEGATIVE

## 2021-10-09 NOTE — Progress Notes (Signed)
PCP - Dr. Alysia Penna Cardiologist - denies  PPM/ICD - n/a Device Orders - n/a Rep Notified - n/a  Chest x-ray - n/a EKG - n/a Stress Test - denies ECHO - denies Cardiac Cath - denies  Sleep Study - denies CPAP - n/a  Blood Thinner Instructions: n/a Aspirin Instructions: n/a  ERAS Protcol - Yes PRE-SURGERY Ensure or G2- Ensure  COVID TEST- 10/09/21. Pending.    Anesthesia review: No  Patient denies shortness of breath, fever, cough and chest pain at PAT appointment   All instructions explained to the patient, with a verbal understanding of the material. Patient agrees to go over the instructions while at home for a better understanding. Patient also instructed to self quarantine after being tested for COVID-19. The opportunity to ask questions was provided.

## 2021-10-10 ENCOUNTER — Ambulatory Visit: Payer: Self-pay | Admitting: Orthopedic Surgery

## 2021-10-10 LAB — SARS CORONAVIRUS 2 (TAT 6-24 HRS): SARS Coronavirus 2: NEGATIVE

## 2021-10-10 NOTE — H&P (Signed)
Brenda Ortega is an 65 y.o. female.   Chief Complaint: back and leg pain HPI: Reason for Visit: (normal) visit for: (back) Location (Lower Extremity): lower back pain ; leg pain bilateral, , Severity: pain level 7/10 Aggravating Factors: walking for Medications: helping a little; Ibuprofen Notes: The patient is 18 days out from L4-5 ESI Down the back of the buttock and legs. Not particular in the left groin  No past medical history on file.  Past Surgical History:  Procedure Laterality Date   APPENDECTOMY  1998    No family history on file. Social History:  reports that she has never smoked. She has never used smokeless tobacco. She reports that she does not drink alcohol and does not use drugs.  Allergies:  Allergies  Allergen Reactions   Penicillins Itching and Rash    Medications: cyclobenzaprine 10 mg tablet ibuprofen 400 mg tablet pantoprazole 20 mg tablet,delayed release rosuvastatin 5 mg tablet   Results for orders placed or performed during the hospital encounter of 10/09/21 (from the past 48 hour(s))  SARS CORONAVIRUS 2 (TAT 6-24 HRS) Nasopharyngeal Nasopharyngeal Swab     Status: None   Collection Time: 10/09/21  2:57 PM   Specimen: Nasopharyngeal Swab  Result Value Ref Range   SARS Coronavirus 2 NEGATIVE NEGATIVE    Comment: (NOTE) SARS-CoV-2 target nucleic acids are NOT DETECTED.  The SARS-CoV-2 RNA is generally detectable in upper and lower respiratory specimens during the acute phase of infection. Negative results do not preclude SARS-CoV-2 infection, do not rule out co-infections with other pathogens, and should not be used as the sole basis for treatment or other patient management decisions. Negative results must be combined with clinical observations, patient history, and epidemiological information. The expected result is Negative.  Fact Sheet for Patients: HairSlick.no  Fact Sheet for Healthcare  Providers: quierodirigir.com  This test is not yet approved or cleared by the [place] FDA and  has been authorized for detection and/or diagnosis of SARS-CoV-2 by FDA under an Emergency Use Authorization (EUA). This EUA will remain  in effect (meaning this test can be used) for the duration of the COVID-19 declaration under Se ction 564(b)(1) of the Act, 21 U.S.C. section 360bbb-3(b)(1), unless the authorization is terminated or revoked sooner.  Performed at [facility], [address], [place], [place] [address]   Surgical pcr screen     Status: None   Collection Time: 10/09/21  2:58 PM   Specimen: Nasal Mucosa; Nasal Swab  Result Value Ref Range   MRSA, PCR NEGATIVE NEGATIVE   Staphylococcus aureus NEGATIVE NEGATIVE    Comment: (NOTE) The Xpert SA Assay (FDA approved for NASAL specimens in patients 75 years of age and older), is one component of a comprehensive surveillance program. It is not intended to diagnose infection nor to guide or monitor treatment. Performed at [facility], [address], [place], [place] [address]   CBC     Status: None   Collection Time: 10/09/21  3:17 PM  Result Value Ref Range   WBC 5.7 4.0 - 10.5 K/uL   RBC 4.53 3.87 - 5.11 MIL/uL   Hemoglobin 14.1 12.0 - 15.0 g/dL   HCT 04.8 88.9 - 16.9 %   MCV 91.4 80.0 - 100.0 fL   MCH 31.1 26.0 - 34.0 pg   MCHC 34.1 30.0 - 36.0 g/dL   RDW 45.0 38.8 - 82.8 %   Platelets 321 150 - 400 K/uL   nRBC 0.0 0.0 -  0.2 %    Comment: Performed at [facility], [address] [place], [place] [address]  Basic metabolic panel     Status: Abnormal   Collection Time: 10/09/21  3:17 PM  Result Value Ref Range   Sodium 138 135 - 145 mmol/L   Potassium 3.8 3.5 - 5.1 mmol/L   Chloride 107 98 - 111 mmol/L   CO2 23 22 - 32 mmol/L   Glucose, Bld 121 (H) 70 - 99 mg/dL    Comment: Glucose reference range applies only to samples taken after fasting for at least 8 hours.    BUN 13 8 - 23 mg/dL   Creatinine, Ser 5.39 0.44 - 1.00 mg/dL   Calcium 8.9 8.9 - 76.7 mg/dL   GFR, Estimated >34 >19 mL/min    Comment: (NOTE) Calculated using the CKD-EPI Creatinine Equation (2021)    Anion gap 8 5 - 15    Comment: Performed at [facility], [address], [place], [place] [address]   DG Lumbar Spine 2-3 Views  Result Date: 10/10/2021 CLINICAL DATA:  Preop for lumbar spine surgery. EXAM: LUMBAR SPINE - 2-3 VIEW COMPARISON:  05/08/2021 FINDINGS: Five non rib-bearing vertebral bodies. Alignment in the lumbar spine is within normal limits. Multilevel degenerate endplate changes. At least mild disc space narrowing at L5-S1. The vertebral body heights are maintained. IMPRESSION: Degenerative changes in lumbar spine.  No acute abnormality. Electronically Signed   By: Richarda Overlie M.D.   On: 10/10/2021 09:23    Review of Systems  Constitutional: Negative.   HENT: Negative.    Eyes: Negative.   Respiratory: Negative.    Cardiovascular: Negative.   Gastrointestinal: Negative.   Endocrine: Negative.   Genitourinary: Negative.   Musculoskeletal:  Positive for back pain, gait problem and myalgias.  Skin: Negative.   Neurological:  Positive for weakness and numbness.   There were no vitals taken for this visit. Physical Exam Constitutional:      Appearance: Normal appearance.  HENT:     Head: Normocephalic.     Right Ear: External ear normal.     Left Ear: External ear normal.     Nose: Nose normal.     Mouth/Throat:     Mouth: Mucous membranes are moist.  Eyes:     Conjunctiva/sclera: Conjunctivae normal.  Cardiovascular:     Rate and Rhythm: Normal rate and regular rhythm.     Pulses: Normal pulses.  Pulmonary:     Effort: Pulmonary effort is normal.     Breath sounds: Normal breath sounds.  Abdominal:     General: Bowel sounds are normal.  Musculoskeletal:     Cervical back: Normal range of motion.     Comments: Straight leg raise low back and buttock  pain. EHLs 5-/5 bilaterally. No DVT. No significant pain with rotation of her left hip.  Neurological:     Mental Status: She is alert.    MRI demonstrates severe spinal stenosis multifactorial L4-5. She has significant lateral recess stenosis at L5-S1.  Small interlaminar window is noted on her x-rays. No instability in flexion extension. 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  Plan:  I discussed at length her condition with her daughter as a Equities trader. Her daughter speaks excellent  English.  She reports injections are no longer helping her. And she is disabled by her symptoms she cannot walk long it radiates down into her legs.  Of the living with it we discussed decompression. Predominantly at L4-5. Although I do feel that her lateral recess is getting affected L5-S1. And she has small interlaminar windows. May decide to take a laminectomy of L5 just to decompress the L5 nerve roots bilaterally and then the lateral recesses at L5-S1.  I had an extensive discussion with the patient concerning the pathology relevant anatomy and treatment options. At this point exhausting conservative treatment and in the presence of a neurologic deficit we discussed microlumbar decompression. I discussed the risks and benefits including bleeding, infection, DVT, PE, anesthetic complications, worsening in their symptoms, improvement in their symptoms, C SF leakage, epidural fibrosis, need for future surgeries such as revision discectomy and lumbar fusion. I also indicated that this is an operation to basically decompress the nerve root to allow recovery as opposed to fixing a herniated disc if iy is encountered and that the  incidence of recurrent chest disc herniation can approach 15%. Also that nerve root recovery is variable and may not recover completely.  I discussed the operative course including overnight in the hospital. Immediate ambulation. Follow-up in 2 weeks for suture removal. 6 weeks until healing of the herniation followed by 6 weeks of reconditioning and strengthening of the core musculature. Also discussed the need to employ the concepts of disc pressure management and core motion following the surgery to minimize the risk of recurrent disc herniation. We will obtain preoperative clearance i if necessary and proceed accordingly.  Preoperative clearance. No history of DVT or MRSA. I have asked about some small multiple healed lesions on her legs. She indicates it is from scratching mosquitoes. No history of MRSA.  Plan microlumbar decompression L4-5, L5-S1  Dorothy Spark, PA-C for Dr Shelle Iron 10/10/2021, 5:02 PM

## 2021-10-10 NOTE — H&P (View-Only) (Signed)
Brenda Ortega is an 65 y.o. female.   Chief Complaint: back and leg pain HPI: Reason for Visit: (normal) visit for: (back) Location (Lower Extremity): lower back pain ; leg pain bilateral, , Severity: pain level 7/10 Aggravating Factors: walking for Medications: helping a little; Ibuprofen Notes: The patient is 18 days out from L4-5 ESI Down the back of the buttock and legs. Not particular in the left groin  No past medical history on file.  Past Surgical History:  Procedure Laterality Date   APPENDECTOMY  1998    No family history on file. Social History:  reports that she has never smoked. She has never used smokeless tobacco. She reports that she does not drink alcohol and does not use drugs.  Allergies:  Allergies  Allergen Reactions   Penicillins Itching and Rash    Medications: cyclobenzaprine 10 mg tablet ibuprofen 400 mg tablet pantoprazole 20 mg tablet,delayed release rosuvastatin 5 mg tablet   Results for orders placed or performed during the hospital encounter of 10/09/21 (from the past 48 hour(s))  SARS CORONAVIRUS 2 (TAT 6-24 HRS) Nasopharyngeal Nasopharyngeal Swab     Status: None   Collection Time: 10/09/21  2:57 PM   Specimen: Nasopharyngeal Swab  Result Value Ref Range   SARS Coronavirus 2 NEGATIVE NEGATIVE    Comment: (NOTE) SARS-CoV-2 target nucleic acids are NOT DETECTED.  The SARS-CoV-2 RNA is generally detectable in upper and lower respiratory specimens during the acute phase of infection. Negative results do not preclude SARS-CoV-2 infection, do not rule out co-infections with other pathogens, and should not be used as the sole basis for treatment or other patient management decisions. Negative results must be combined with clinical observations, patient history, and epidemiological information. The expected result is Negative.  Fact Sheet for Patients: https://www.fda.gov/media/138098/download  Fact Sheet for Healthcare  Providers: https://www.fda.gov/media/138095/download  This test is not yet approved or cleared by the [place] FDA and  has been authorized for detection and/or diagnosis of SARS-CoV-2 by FDA under an Emergency Use Authorization (EUA). This EUA will remain  in effect (meaning this test can be used) for the duration of the COVID-19 declaration under Se ction 564(b)(1) of the Act, 21 U.S.C. section 360bbb-3(b)(1), unless the authorization is terminated or revoked sooner.  Performed at [facility], [address] [place], [place] [address]   Surgical pcr screen     Status: None   Collection Time: 10/09/21  2:58 PM   Specimen: Nasal Mucosa; Nasal Swab  Result Value Ref Range   MRSA, PCR NEGATIVE NEGATIVE   Staphylococcus aureus NEGATIVE NEGATIVE    Comment: (NOTE) The Xpert SA Assay (FDA approved for NASAL specimens in patients 22 years of age and older), is one component of a comprehensive surveillance program. It is not intended to diagnose infection nor to guide or monitor treatment. Performed at [facility], [address] [place], [place] [address]   CBC     Status: None   Collection Time: 10/09/21  3:17 PM  Result Value Ref Range   WBC 5.7 4.0 - 10.5 K/uL   RBC 4.53 3.87 - 5.11 MIL/uL   Hemoglobin 14.1 12.0 - 15.0 g/dL   HCT 41.4 36.0 - 46.0 %   MCV 91.4 80.0 - 100.0 fL   MCH 31.1 26.0 - 34.0 pg   MCHC 34.1 30.0 - 36.0 g/dL   RDW 12.3 11.5 - 15.5 %   Platelets 321 150 - 400 K/uL   nRBC 0.0 0.0 -   0.2 %    Comment: Performed at [facility], [address] [place], [place] [address]  Basic metabolic panel     Status: Abnormal   Collection Time: 10/09/21  3:17 PM  Result Value Ref Range   Sodium 138 135 - 145 mmol/L   Potassium 3.8 3.5 - 5.1 mmol/L   Chloride 107 98 - 111 mmol/L   CO2 23 22 - 32 mmol/L   Glucose, Bld 121 (H) 70 - 99 mg/dL    Comment: Glucose reference range applies only to samples taken after fasting for at least 8 hours.    BUN 13 8 - 23 mg/dL   Creatinine, Ser 0.74 0.44 - 1.00 mg/dL   Calcium 8.9 8.9 - 10.3 mg/dL   GFR, Estimated >60 >60 mL/min    Comment: (NOTE) Calculated using the CKD-EPI Creatinine Equation (2021)    Anion gap 8 5 - 15    Comment: Performed at  [facility], [address] [place], [place] [address]   DG Lumbar Spine 2-3 Views  Result Date: 10/10/2021 CLINICAL DATA:  Preop for lumbar spine surgery. EXAM: LUMBAR SPINE - 2-3 VIEW COMPARISON:  05/08/2021 FINDINGS: Five non rib-bearing vertebral bodies. Alignment in the lumbar spine is within normal limits. Multilevel degenerate endplate changes. At least mild disc space narrowing at L5-S1. The vertebral body heights are maintained. IMPRESSION: Degenerative changes in lumbar spine.  No acute abnormality. Electronically Signed   By: Adam  Henn M.D.   On: 10/10/2021 09:23    Review of Systems  Constitutional: Negative.   HENT: Negative.    Eyes: Negative.   Respiratory: Negative.    Cardiovascular: Negative.   Gastrointestinal: Negative.   Endocrine: Negative.   Genitourinary: Negative.   Musculoskeletal:  Positive for back pain, gait problem and myalgias.  Skin: Negative.   Neurological:  Positive for weakness and numbness.   There were no vitals taken for this visit. Physical Exam Constitutional:      Appearance: Normal appearance.  HENT:     Head: Normocephalic.     Right Ear: External ear normal.     Left Ear: External ear normal.     Nose: Nose normal.     Mouth/Throat:     Mouth: Mucous membranes are moist.  Eyes:     Conjunctiva/sclera: Conjunctivae normal.  Cardiovascular:     Rate and Rhythm: Normal rate and regular rhythm.     Pulses: Normal pulses.  Pulmonary:     Effort: Pulmonary effort is normal.     Breath sounds: Normal breath sounds.  Abdominal:     General: Bowel sounds are normal.  Musculoskeletal:     Cervical back: Normal range of motion.     Comments: Straight leg raise low back and buttock  pain. EHLs 5-/5 bilaterally. No DVT. No significant pain with rotation of her left hip.  Neurological:     Mental Status: She is alert.    MRI demonstrates severe spinal stenosis multifactorial L4-5. She has significant lateral recess stenosis at L5-S1.  Small interlaminar window is noted on her x-rays. No instability in flexion extension. 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  Plan:  I discussed at length her condition with her daughter as a interpreter. Her daughter speaks excellent   English.  She reports injections are no longer helping her. And she is disabled by her symptoms she cannot walk long it radiates down into her legs.  Of the living with it we discussed decompression. Predominantly at L4-5. Although I do feel that her lateral recess is getting affected L5-S1. And she has small interlaminar windows. May decide to take a laminectomy of L5 just to decompress the L5 nerve roots bilaterally and then the lateral recesses at L5-S1.  I had an extensive discussion with the patient concerning the pathology relevant anatomy and treatment options. At this point exhausting conservative treatment and in the presence of a neurologic deficit we discussed microlumbar decompression. I discussed the risks and benefits including bleeding, infection, DVT, PE, anesthetic complications, worsening in their symptoms, improvement in their symptoms, C SF leakage, epidural fibrosis, need for future surgeries such as revision discectomy and lumbar fusion. I also indicated that this is an operation to basically decompress the nerve root to allow recovery as opposed to fixing a herniated disc if iy is encountered and that the  incidence of recurrent chest disc herniation can approach 15%. Also that nerve root recovery is variable and may not recover completely.  I discussed the operative course including overnight in the hospital. Immediate ambulation. Follow-up in 2 weeks for suture removal. 6 weeks until healing of the herniation followed by 6 weeks of reconditioning and strengthening of the core musculature. Also discussed the need to employ the concepts of disc pressure management and core motion following the surgery to minimize the risk of recurrent disc herniation. We will obtain preoperative clearance i if necessary and proceed accordingly.  Preoperative clearance. No history of DVT or MRSA. I have asked about some small multiple healed lesions on her legs. She indicates it is from scratching mosquitoes. No history of MRSA.  Plan microlumbar decompression L4-5, L5-S1  Dorothy Spark, PA-C for Dr Shelle Iron 10/10/2021, 5:02 PM

## 2021-10-11 ENCOUNTER — Encounter (HOSPITAL_COMMUNITY): Payer: Self-pay | Admitting: Specialist

## 2021-10-11 ENCOUNTER — Ambulatory Visit (HOSPITAL_COMMUNITY): Payer: BC Managed Care – PPO | Admitting: Certified Registered"

## 2021-10-11 ENCOUNTER — Ambulatory Visit (HOSPITAL_COMMUNITY): Payer: BC Managed Care – PPO

## 2021-10-11 ENCOUNTER — Ambulatory Visit (HOSPITAL_COMMUNITY)
Admission: RE | Admit: 2021-10-11 | Discharge: 2021-10-11 | Disposition: A | Discharge status: Home / Self Care | Payer: BC Managed Care – PPO | Attending: Specialist | Admitting: Specialist

## 2021-10-11 ENCOUNTER — Ambulatory Visit (HOSPITAL_COMMUNITY)
Admission: RE | Disposition: A | Discharge status: Home / Self Care | Payer: Self-pay | Source: Home / Self Care | Attending: Specialist

## 2021-10-11 ENCOUNTER — Other Ambulatory Visit: Payer: Self-pay

## 2021-10-11 DIAGNOSIS — M879 Osteonecrosis, unspecified: Secondary | ICD-10-CM | POA: Insufficient documentation

## 2021-10-11 DIAGNOSIS — M48061 Spinal stenosis, lumbar region without neurogenic claudication: Secondary | ICD-10-CM | POA: Diagnosis present

## 2021-10-11 DIAGNOSIS — M48062 Spinal stenosis, lumbar region with neurogenic claudication: Secondary | ICD-10-CM | POA: Insufficient documentation

## 2021-10-11 DIAGNOSIS — Z419 Encounter for procedure for purposes other than remedying health state, unspecified: Secondary | ICD-10-CM

## 2021-10-11 DIAGNOSIS — M4807 Spinal stenosis, lumbosacral region: Secondary | ICD-10-CM | POA: Diagnosis not present

## 2021-10-11 DIAGNOSIS — Z20822 Contact with and (suspected) exposure to covid-19: Secondary | ICD-10-CM | POA: Insufficient documentation

## 2021-10-11 HISTORY — PX: LUMBAR LAMINECTOMY/DECOMPRESSION MICRODISCECTOMY: SHX5026

## 2021-10-11 SURGERY — LUMBAR LAMINECTOMY/DECOMPRESSION MICRODISCECTOMY 2 LEVELS
Anesthesia: General | Site: Spine Lumbar

## 2021-10-11 MED ORDER — CEFAZOLIN SODIUM 1 G IJ SOLR
INTRAMUSCULAR | Status: AC
  Filled 2021-10-11: qty 20

## 2021-10-11 MED ORDER — ONDANSETRON HCL 4 MG/2ML IJ SOLN
4.0000 mg | Freq: Four times a day (QID) | INTRAMUSCULAR | Status: DC | PRN
  Administered 2021-10-11: 4 mg via INTRAVENOUS
  Filled 2021-10-11: qty 2

## 2021-10-11 MED ORDER — LIDOCAINE 2% (20 MG/ML) 5 ML SYRINGE
INTRAMUSCULAR | Status: DC | PRN
  Administered 2021-10-11: 100 mg via INTRAVENOUS

## 2021-10-11 MED ORDER — OXYCODONE HCL 5 MG PO TABS
10.0000 mg | ORAL_TABLET | ORAL | Status: DC | PRN
  Administered 2021-10-11: 10 mg via ORAL
  Filled 2021-10-11: qty 2

## 2021-10-11 MED ORDER — PHENYLEPHRINE 40 MCG/ML (10ML) SYRINGE FOR IV PUSH (FOR BLOOD PRESSURE SUPPORT)
PREFILLED_SYRINGE | INTRAVENOUS | Status: DC | PRN
  Administered 2021-10-11: 80 ug via INTRAVENOUS
  Administered 2021-10-11 (×2): 120 ug via INTRAVENOUS
  Administered 2021-10-11 (×2): 80 ug via INTRAVENOUS

## 2021-10-11 MED ORDER — EPHEDRINE SULFATE-NACL 50-0.9 MG/10ML-% IV SOSY
PREFILLED_SYRINGE | INTRAVENOUS | Status: DC | PRN
  Administered 2021-10-11: 5 mg via INTRAVENOUS
  Administered 2021-10-11: 10 mg via INTRAVENOUS

## 2021-10-11 MED ORDER — ALUM & MAG HYDROXIDE-SIMETH 200-200-20 MG/5ML PO SUSP: 30.0000 mL | Freq: Four times a day (QID) | ORAL | Status: DC | PRN

## 2021-10-11 MED ORDER — PHENYLEPHRINE HCL (PRESSORS) 10 MG/ML IV SOLN
INTRAVENOUS | Status: AC
  Filled 2021-10-11: qty 2

## 2021-10-11 MED ORDER — LIDOCAINE 2% (20 MG/ML) 5 ML SYRINGE
INTRAMUSCULAR | Status: AC
  Filled 2021-10-11: qty 5

## 2021-10-11 MED ORDER — MENTHOL 3 MG MT LOZG: 1.0000 | LOZENGE | OROMUCOSAL | Status: DC | PRN

## 2021-10-11 MED ORDER — DOCUSATE SODIUM 100 MG PO CAPS: 100.0000 mg | ORAL_CAPSULE | Freq: Two times a day (BID) | ORAL | 1 refills | Status: DC | PRN

## 2021-10-11 MED ORDER — THROMBIN 20000 UNITS EX SOLR: CUTANEOUS | Status: DC | PRN

## 2021-10-11 MED ORDER — BISACODYL 5 MG PO TBEC: 5.0000 mg | DELAYED_RELEASE_TABLET | Freq: Every day | ORAL | Status: DC | PRN

## 2021-10-11 MED ORDER — POLYETHYLENE GLYCOL 3350 17 G PO PACK: 17.0000 g | PACK | Freq: Every day | ORAL | 0 refills | Status: DC

## 2021-10-11 MED ORDER — PHENYLEPHRINE 40 MCG/ML (10ML) SYRINGE FOR IV PUSH (FOR BLOOD PRESSURE SUPPORT)
PREFILLED_SYRINGE | INTRAVENOUS | Status: AC
  Filled 2021-10-11: qty 10

## 2021-10-11 MED ORDER — TRANEXAMIC ACID-NACL 1000-0.7 MG/100ML-% IV SOLN
INTRAVENOUS | Status: AC
  Filled 2021-10-11: qty 100

## 2021-10-11 MED ORDER — ONDANSETRON HCL 4 MG/2ML IJ SOLN
INTRAMUSCULAR | Status: AC
  Filled 2021-10-11: qty 2

## 2021-10-11 MED ORDER — KCL IN DEXTROSE-NACL 20-5-0.45 MEQ/L-%-% IV SOLN: INTRAVENOUS | Status: DC

## 2021-10-11 MED ORDER — FENTANYL CITRATE (PF) 250 MCG/5ML IJ SOLN
INTRAMUSCULAR | Status: AC
  Filled 2021-10-11: qty 5

## 2021-10-11 MED ORDER — ACETAMINOPHEN 500 MG PO TABS
1000.0000 mg | ORAL_TABLET | Freq: Four times a day (QID) | ORAL | Status: DC
  Administered 2021-10-11: 1000 mg via ORAL
  Filled 2021-10-11: qty 2

## 2021-10-11 MED ORDER — PHENYLEPHRINE HCL-NACL 20-0.9 MG/250ML-% IV SOLN
INTRAVENOUS | Status: DC | PRN
  Administered 2021-10-11: 50 ug/min via INTRAVENOUS

## 2021-10-11 MED ORDER — DOCUSATE SODIUM 100 MG PO CAPS: 100.0000 mg | ORAL_CAPSULE | Freq: Two times a day (BID) | ORAL | Status: DC

## 2021-10-11 MED ORDER — EPHEDRINE 5 MG/ML INJ
INTRAVENOUS | Status: AC
  Filled 2021-10-11: qty 5

## 2021-10-11 MED ORDER — VANCOMYCIN HCL IN DEXTROSE 1-5 GM/200ML-% IV SOLN
1000.0000 mg | Freq: Once | INTRAVENOUS | Status: AC
  Administered 2021-10-11: 1000 mg via INTRAVENOUS
  Filled 2021-10-11: qty 200

## 2021-10-11 MED ORDER — TRANEXAMIC ACID-NACL 1000-0.7 MG/100ML-% IV SOLN
INTRAVENOUS | Status: DC | PRN
  Administered 2021-10-11: 1000 mg via INTRAVENOUS

## 2021-10-11 MED ORDER — FENTANYL CITRATE (PF) 100 MCG/2ML IJ SOLN
INTRAMUSCULAR | Status: DC | PRN
  Administered 2021-10-11: 50 ug via INTRAVENOUS
  Administered 2021-10-11: 100 ug via INTRAVENOUS

## 2021-10-11 MED ORDER — OXYCODONE HCL 5 MG PO TABS: 5.0000 mg | ORAL_TABLET | ORAL | Status: DC | PRN

## 2021-10-11 MED ORDER — PANTOPRAZOLE SODIUM 20 MG PO TBEC: 20.0000 mg | DELAYED_RELEASE_TABLET | Freq: Every morning | ORAL | Status: DC

## 2021-10-11 MED ORDER — PROMETHAZINE HCL 25 MG/ML IJ SOLN: 6.2500 mg | INTRAMUSCULAR | Status: DC | PRN

## 2021-10-11 MED ORDER — HYDROMORPHONE HCL 1 MG/ML IJ SOLN
0.2500 mg | INTRAMUSCULAR | Status: DC | PRN
  Administered 2021-10-11 (×2): 0.5 mg via INTRAVENOUS

## 2021-10-11 MED ORDER — OXYCODONE HCL 5 MG PO TABS: 5.0000 mg | ORAL_TABLET | ORAL | 0 refills | Status: DC | PRN

## 2021-10-11 MED ORDER — HYDROMORPHONE HCL 1 MG/ML IJ SOLN
INTRAMUSCULAR | Status: AC
  Filled 2021-10-11: qty 1

## 2021-10-11 MED ORDER — MIDAZOLAM HCL 2 MG/2ML IJ SOLN
INTRAMUSCULAR | Status: AC
  Filled 2021-10-11: qty 2

## 2021-10-11 MED ORDER — MEPERIDINE HCL 25 MG/ML IJ SOLN: 6.2500 mg | INTRAMUSCULAR | Status: DC | PRN

## 2021-10-11 MED ORDER — DEXAMETHASONE SODIUM PHOSPHATE 10 MG/ML IJ SOLN
INTRAMUSCULAR | Status: DC | PRN
  Administered 2021-10-11: 5 mg via INTRAVENOUS

## 2021-10-11 MED ORDER — DEXAMETHASONE SODIUM PHOSPHATE 10 MG/ML IJ SOLN
INTRAMUSCULAR | Status: AC
  Filled 2021-10-11: qty 1

## 2021-10-11 MED ORDER — 0.9 % SODIUM CHLORIDE (POUR BTL) OPTIME
TOPICAL | Status: DC | PRN
  Administered 2021-10-11: 1000 mL

## 2021-10-11 MED ORDER — KETOROLAC TROMETHAMINE 30 MG/ML IJ SOLN
30.0000 mg | Freq: Once | INTRAMUSCULAR | Status: AC | PRN
  Administered 2021-10-11: 30 mg via INTRAVENOUS

## 2021-10-11 MED ORDER — ROCURONIUM BROMIDE 10 MG/ML (PF) SYRINGE
PREFILLED_SYRINGE | INTRAVENOUS | Status: DC | PRN
Start: 2021-10-11 — End: 2021-10-11
  Administered 2021-10-11: 60 mg via INTRAVENOUS

## 2021-10-11 MED ORDER — CEFAZOLIN SODIUM-DEXTROSE 2-4 GM/100ML-% IV SOLN
2.0000 g | INTRAVENOUS | Status: AC
  Administered 2021-10-11: 2 g via INTRAVENOUS

## 2021-10-11 MED ORDER — POLYETHYLENE GLYCOL 3350 17 G PO PACK: 17.0000 g | PACK | Freq: Every day | ORAL | Status: DC

## 2021-10-11 MED ORDER — MIDAZOLAM HCL 2 MG/2ML IJ SOLN
INTRAMUSCULAR | Status: DC | PRN
Start: 2021-10-11 — End: 2021-10-11
  Administered 2021-10-11: 2 mg via INTRAVENOUS

## 2021-10-11 MED ORDER — RISAQUAD PO CAPS
1.0000 | ORAL_CAPSULE | Freq: Every day | ORAL | Status: DC
  Filled 2021-10-11: qty 1

## 2021-10-11 MED ORDER — METHOCARBAMOL 1000 MG/10ML IJ SOLN
500.0000 mg | Freq: Four times a day (QID) | INTRAVENOUS | Status: DC | PRN
  Filled 2021-10-11: qty 5

## 2021-10-11 MED ORDER — BUPIVACAINE-EPINEPHRINE 0.5% -1:200000 IJ SOLN
INTRAMUSCULAR | Status: AC
  Filled 2021-10-11: qty 1

## 2021-10-11 MED ORDER — ROCURONIUM BROMIDE 10 MG/ML (PF) SYRINGE
PREFILLED_SYRINGE | INTRAVENOUS | Status: AC
  Filled 2021-10-11: qty 10

## 2021-10-11 MED ORDER — PHENOL 1.4 % MT LIQD: 1.0000 | OROMUCOSAL | Status: DC | PRN

## 2021-10-11 MED ORDER — PROPOFOL 10 MG/ML IV BOLUS
INTRAVENOUS | Status: AC
  Filled 2021-10-11: qty 20

## 2021-10-11 MED ORDER — ONDANSETRON HCL 4 MG PO TABS: 4.0000 mg | ORAL_TABLET | Freq: Four times a day (QID) | ORAL | Status: DC | PRN

## 2021-10-11 MED ORDER — ONDANSETRON HCL 4 MG PO TABS: 4.0000 mg | ORAL_TABLET | Freq: Four times a day (QID) | ORAL | 0 refills | Status: DC | PRN

## 2021-10-11 MED ORDER — THROMBIN 5000 UNITS EX SOLR
CUTANEOUS | Status: AC
  Filled 2021-10-11: qty 5000

## 2021-10-11 MED ORDER — LACTATED RINGERS IV SOLN: INTRAVENOUS | Status: DC

## 2021-10-11 MED ORDER — METHOCARBAMOL 500 MG PO TABS: 500.0000 mg | ORAL_TABLET | Freq: Four times a day (QID) | ORAL | Status: DC | PRN

## 2021-10-11 MED ORDER — THROMBIN 20000 UNITS EX KIT
PACK | CUTANEOUS | Status: AC
  Filled 2021-10-11: qty 1

## 2021-10-11 MED ORDER — ONDANSETRON HCL 4 MG/2ML IJ SOLN
INTRAMUSCULAR | Status: DC | PRN
  Administered 2021-10-11: 4 mg via INTRAVENOUS

## 2021-10-11 MED ORDER — KETOROLAC TROMETHAMINE 30 MG/ML IJ SOLN
INTRAMUSCULAR | Status: AC
  Filled 2021-10-11: qty 1

## 2021-10-11 MED ORDER — ACETAMINOPHEN 10 MG/ML IV SOLN
1000.0000 mg | INTRAVENOUS | Status: AC
  Administered 2021-10-11: 1000 mg via INTRAVENOUS
  Filled 2021-10-11: qty 100

## 2021-10-11 MED ORDER — CHLORHEXIDINE GLUCONATE 0.12 % MT SOLN
OROMUCOSAL | Status: AC
  Administered 2021-10-11: 15 mL
  Filled 2021-10-11: qty 15

## 2021-10-11 MED ORDER — SUGAMMADEX SODIUM 200 MG/2ML IV SOLN
INTRAVENOUS | Status: DC | PRN
  Administered 2021-10-11: 100 mg via INTRAVENOUS

## 2021-10-11 MED ORDER — PROPOFOL 10 MG/ML IV BOLUS
INTRAVENOUS | Status: DC | PRN
  Administered 2021-10-11: 150 mg via INTRAVENOUS
  Administered 2021-10-11: 10 mg via INTRAVENOUS

## 2021-10-11 MED ORDER — BUPIVACAINE-EPINEPHRINE 0.5% -1:200000 IJ SOLN
INTRAMUSCULAR | Status: DC | PRN
  Administered 2021-10-11: 3 mL

## 2021-10-11 MED ORDER — VANCOMYCIN HCL IN DEXTROSE 1-5 GM/200ML-% IV SOLN
1000.0000 mg | INTRAVENOUS | Status: AC
  Administered 2021-10-11: 1000 mg via INTRAVENOUS
  Filled 2021-10-11: qty 200

## 2021-10-11 SURGICAL SUPPLY — 60 items
BAG COUNTER SPONGE SURGICOUNT (BAG) ×2 IMPLANT
BAG DECANTER FOR FLEXI CONT (MISCELLANEOUS) ×2 IMPLANT
BAND RUBBER #18 3X1/16 STRL (MISCELLANEOUS) ×4 IMPLANT
BUR RND DIAMOND ELITE 4.0 (BURR) IMPLANT
BUR STRYKR EGG 5.0 (BURR) IMPLANT
CARTRIDGE OIL MAESTRO DRILL (MISCELLANEOUS) IMPLANT
CLEANER TIP ELECTROSURG 2X2 (MISCELLANEOUS) ×2 IMPLANT
CNTNR URN SCR LID CUP LEK RST (MISCELLANEOUS) ×1 IMPLANT
CONT SPEC 4OZ STRL OR WHT (MISCELLANEOUS) ×1
DIFFUSER DRILL AIR PNEUMATIC (MISCELLANEOUS) IMPLANT
DRAPE LAPAROTOMY 100X72X124 (DRAPES) ×2 IMPLANT
DRAPE MICROSCOPE LEICA (MISCELLANEOUS) ×2 IMPLANT
DRAPE SHEET LG 3/4 BI-LAMINATE (DRAPES) ×2 IMPLANT
DRAPE SURG 17X11 SM STRL (DRAPES) ×2 IMPLANT
DRAPE UTILITY XL STRL (DRAPES) ×2 IMPLANT
DRESSING AQUACEL AG SP 3.5X6 (GAUZE/BANDAGES/DRESSINGS) ×1 IMPLANT
DRSG AQUACEL AG ADV 3.5X 4 (GAUZE/BANDAGES/DRESSINGS) IMPLANT
DRSG AQUACEL AG ADV 3.5X 6 (GAUZE/BANDAGES/DRESSINGS) ×2 IMPLANT
DRSG AQUACEL AG SP 3.5X6 (GAUZE/BANDAGES/DRESSINGS) ×2
DRSG TELFA 3X8 NADH (GAUZE/BANDAGES/DRESSINGS) IMPLANT
DURAPREP 26ML APPLICATOR (WOUND CARE) ×2 IMPLANT
DURASEAL SPINE SEALANT 3ML (MISCELLANEOUS) IMPLANT
ELECT BLADE 4.0 EZ CLEAN MEGAD (MISCELLANEOUS)
ELECT REM PT RETURN 9FT ADLT (ELECTROSURGICAL) ×2
ELECTRODE BLDE 4.0 EZ CLN MEGD (MISCELLANEOUS) IMPLANT
ELECTRODE REM PT RTRN 9FT ADLT (ELECTROSURGICAL) ×1 IMPLANT
GLOVE SURG POLYISO LF SZ7.5 (GLOVE) ×2 IMPLANT
GLOVE SURG POLYISO LF SZ8 (GLOVE) ×4 IMPLANT
GLOVE SURG UNDER POLY LF SZ7 (GLOVE) ×2 IMPLANT
GOWN STRL REUS W/ TWL LRG LVL3 (GOWN DISPOSABLE) ×1 IMPLANT
GOWN STRL REUS W/ TWL XL LVL3 (GOWN DISPOSABLE) ×2 IMPLANT
GOWN STRL REUS W/TWL LRG LVL3 (GOWN DISPOSABLE) ×1
GOWN STRL REUS W/TWL XL LVL3 (GOWN DISPOSABLE) ×2
IV CATH 14GX2 1/4 (CATHETERS) ×2 IMPLANT
KIT BASIN OR (CUSTOM PROCEDURE TRAY) ×2 IMPLANT
KIT POSITION SURG JACKSON T1 (MISCELLANEOUS) IMPLANT
NEEDLE 22X1 1/2 (OR ONLY) (NEEDLE) ×2 IMPLANT
NEEDLE SPNL 18GX3.5 QUINCKE PK (NEEDLE) ×4 IMPLANT
OIL CARTRIDGE MAESTRO DRILL (MISCELLANEOUS)
PACK LAMINECTOMY NEURO (CUSTOM PROCEDURE TRAY) ×2 IMPLANT
PATTIES SURGICAL .75X.75 (GAUZE/BANDAGES/DRESSINGS) ×2 IMPLANT
SPONGE SURGIFOAM ABS GEL 100 (HEMOSTASIS) ×2 IMPLANT
SPONGE T-LAP 4X18 ~~LOC~~+RFID (SPONGE) IMPLANT
STAPLER VISISTAT (STAPLE) ×4 IMPLANT
STRIP CLOSURE SKIN 1/2X4 (GAUZE/BANDAGES/DRESSINGS) ×2 IMPLANT
SUT BONE WAX W31G (SUTURE) ×2 IMPLANT
SUT NURALON 4 0 TR CR/8 (SUTURE) IMPLANT
SUT PROLENE 3 0 PS 2 (SUTURE) IMPLANT
SUT VIC AB 1 CT1 27 (SUTURE) ×1
SUT VIC AB 1 CT1 27XBRD ANTBC (SUTURE) ×1 IMPLANT
SUT VIC AB 1-0 CT2 27 (SUTURE) IMPLANT
SUT VIC AB 2-0 CT1 27 (SUTURE) ×1
SUT VIC AB 2-0 CT1 TAPERPNT 27 (SUTURE) ×1 IMPLANT
SUT VIC AB 2-0 CT2 27 (SUTURE) IMPLANT
SYR 3ML LL SCALE MARK (SYRINGE) ×2 IMPLANT
TOWEL GREEN STERILE (TOWEL DISPOSABLE) ×2 IMPLANT
TOWEL GREEN STERILE FF (TOWEL DISPOSABLE) ×2 IMPLANT
TRAY FOLEY MTR SLVR 16FR STAT (SET/KITS/TRAYS/PACK) ×2 IMPLANT
WIPE CHG CHLORHEXIDINE 2% (PERSONAL CARE ITEMS) ×2 IMPLANT
YANKAUER SUCT BULB TIP NO VENT (SUCTIONS) ×2 IMPLANT

## 2021-10-11 NOTE — Discharge Instructions (Signed)

## 2021-10-11 NOTE — Plan of Care (Signed)
Pt and husband given D/C instructions with verbal understanding. Rx's were sent to the pharmacy by MD. Pt's incision is clean and dry with no sign of infection. Pt's IV was removed prior to D/C. Pt D/C'd home via wheelchair per MD order. Pt is stable @ D/C and has no other needs at this time. Jakobe Blau, RN 

## 2021-10-11 NOTE — Evaluation (Signed)
Occupational Therapy Evaluation Patient Details Name: Brenda Ortega MRN: [id number] DOB: 1956-07-14 Today's Date: 10/11/2021   History of Present Illness Pt is a 65 y/o female s/p L4-S1 microlumbar decompression on 11/3. PMH includes appendectomy.   Clinical Impression   Pt and husband educated in back precautions related to ADL and IADL. Pt requiring up to min guard assist for mobility and ADL, but limited to standing at EOB due to dizziness and nausea. Will follow acutely if pt remains admitted tomorrow to reinforce education.      Recommendations for follow up therapy are one component of a multi-disciplinary discharge planning process, led by the attending physician.  Recommendations may be updated based on patient status, additional functional criteria and insurance authorization.   Follow Up Recommendations  No OT follow up    Assistance Recommended at Discharge Intermittent Supervision/Assistance  Functional Status Assessment  Patient has had a recent decline in their functional status and demonstrates the ability to make significant improvements in function in a reasonable and predictable amount of time.  Equipment Recommendations  None recommended by OT    Recommendations for Other Services       Precautions / Restrictions Precautions Precautions: Back Precaution Booklet Issued: Yes (comment) Precaution Comments: reviewed back precautions with pt and husband Restrictions Weight Bearing Restrictions: No      Mobility Bed Mobility Overal bed mobility: Needs Assistance Bed Mobility: Rolling;Sidelying to Sit;Sit to Sidelying Rolling: Min guard Sidelying to sit: Min guard     Sit to sidelying: Min guard General bed mobility comments: cues for technique    Transfers Overall transfer level: Needs assistance Equipment used: None Transfers: Sit to/from Stand Sit to Stand: Min guard           General transfer comment: min guard for safety, pt holding onto bed rail       Balance Overall balance assessment: Needs assistance Sitting-balance support: No upper extremity supported;Feet supported Sitting balance-Leahy Scale: Fair     Standing balance support: Single extremity supported Standing balance-Leahy Scale: Poor Standing balance comment: dizziness, requiring one hand stabilization                           ADL either performed or assessed with clinical judgement   ADL Overall ADL's : Needs assistance/impaired Eating/Feeding: Independent;Sitting   Grooming: Sitting;Supervision/safety   Upper Body Bathing: Supervision/ safety;Sitting Upper Body Bathing Details (indicate cue type and reason): recommended long handled bath sponge, to avoid twisting when washing back Lower Body Bathing: Min guard;Sit to/from stand Lower Body Bathing Details (indicate cue type and reason): instructed to cross foot over opposite knee Upper Body Dressing : Set up;Sitting   Lower Body Dressing: Min guard;Sit to/from stand   Toilet Transfer: Min guard;Ambulation (pushed IV pole)     Toileting - Clothing Manipulation Details (indicate cue type and reason): instructed to avoid twisting with pericare     Functional mobility during ADLs: Min guard (pushing IV pole) General ADL Comments: Educated pt and husband in IADL to avoid at home to adhere to precautions.     Vision Ability to See in Adequate Light: 0 Adequate Patient Visual Report: No change from baseline       Perception     Praxis      Pertinent Vitals/Pain Pain Assessment: Faces Faces Pain Scale: Hurts even more Pain Location: back Pain Descriptors / Indicators: Grimacing;Guarding Pain Intervention(s): Monitored during session;Premedicated before session;Repositioned     Hand Dominance Right  Extremity/Trunk Assessment Upper Extremity Assessment Upper Extremity Assessment: Overall WFL for tasks assessed   Lower Extremity Assessment Lower Extremity Assessment: Defer to PT  evaluation   Cervical / Trunk Assessment Cervical / Trunk Assessment: Back Surgery   Communication Communication Communication: Prefers language other than English;Other (comment) (but speaks [place] well)   Cognition Arousal/Alertness: Awake/alert Behavior During Therapy: WFL for tasks assessed/performed Overall Cognitive Status: Within Functional Limits for tasks assessed                                       General Comments       Exercises     Shoulder Instructions      Home Living Family/patient expects to be discharged to:: Private residence Living Arrangements: Spouse/significant other Available Help at Discharge: Family;Available 24 hours/day Type of Home: House Home Access: Stairs to enter [employer] of Steps: 3 Entrance Stairs-Rails: None Home Layout: One level     Bathroom Shower/Tub: Chief Strategy Officer: Handicapped height     Home Equipment: Agricultural consultant (2 wheels);Shower seat          Prior Functioning/Environment Prior Level of Function : Independent/Modified Independent             Mobility Comments: Reports occasionally using RW          OT Problem List: Impaired balance (sitting and/or standing);Decreased activity tolerance;Pain;Decreased knowledge of precautions      OT Treatment/Interventions: Self-care/ADL training;Patient/family education;Balance training;Therapeutic activities    OT Goals(Current goals can be found in the care plan section) Acute Rehab OT Goals Patient Stated Goal: resolve dizziness OT Goal Formulation: With patient Time For Goal Achievement: 10/25/21 Potential to Achieve Goals: Good ADL Goals Additional ADL Goal #1: (P) Pt will generalize back precautions in ADL and mobility independently. Additional ADL Goal #2: (P) Pt will gather items necessary for ADL around her room modified independently. Additional ADL Goal #3: (P) Pt will perform basic ADL modified  independently.  OT Frequency: Min 2X/week   Barriers to D/C:            Co-evaluation              AM-PAC OT "6 Clicks" Daily Activity     Outcome Measure Help from another person eating meals?: None Help from another person taking care of personal grooming?: A Little Help from another person toileting, which includes using toliet, bedpan, or urinal?: A Little Help from another person bathing (including washing, rinsing, drying)?: A Little Help from another person to put on and taking off regular upper body clothing?: None Help from another person to put on and taking off regular lower body clothing?: A Little 6 Click Score: 20   End of Session Nurse Communication: Other (comment) (RN aware of nausea)  Activity Tolerance: Treatment limited secondary to medical complications (Comment) (dizziness, nausea) Patient left: in bed;with call bell/phone within reach;with family/visitor present  OT Visit Diagnosis: Dizziness and giddiness (R42);Unsteadiness on feet (R26.81);Pain                Time: 1514-1530 OT Time Calculation (min): 16 min Charges:  OT General Charges $OT Visit: 1 Visit OT Evaluation $OT Eval Low Complexity: 1 Low  Martie Round, OTR/L Acute Rehabilitation Services Pager: [phone] Office: [phone]   Evern Bio 10/11/2021, 3:37 PM

## 2021-10-11 NOTE — Anesthesia Postprocedure Evaluation (Signed)
Anesthesia Post Note  Patient: Brenda Ortega  Procedure(s) Performed: Microlumbar decompression Lumbar four -five, Lumabr five-Sacral one (Spine Lumbar)     Patient location during evaluation: PACU Anesthesia Type: General Level of consciousness: awake Pain management: pain level controlled Vital Signs Assessment: post-procedure vital signs reviewed and stable Respiratory status: spontaneous breathing Cardiovascular status: stable Postop Assessment: no apparent nausea or vomiting Anesthetic complications: no   No notable events documented.  Last Vitals:  Vitals:   10/11/21 1035 10/11/21 1050  BP: 118/71 126/71  Pulse: 72 70  Resp: 19 18  Temp:    SpO2: 94% 94%    Last Pain:  Vitals:   10/11/21 1050  TempSrc:   PainSc: 5                  John F Allysson Rinehimer Jr

## 2021-10-11 NOTE — Anesthesia Preprocedure Evaluation (Signed)
Anesthesia Evaluation  Patient identified by MRN, date of birth, ID band Patient awake    Reviewed: Allergy & Precautions, NPO status , Patient's Chart, lab work & pertinent test results  Airway Mallampati: I       Dental no notable dental hx.    Pulmonary neg pulmonary ROS,    Pulmonary exam normal        Cardiovascular negative cardio ROS Normal cardiovascular exam     Neuro/Psych negative neurological ROS  negative psych ROS   GI/Hepatic Neg liver ROS, GERD  Medicated,  Endo/Other  negative endocrine ROS  Renal/GU negative Renal ROS  negative genitourinary   Musculoskeletal   Abdominal Normal abdominal exam  (+)   Peds  Hematology negative hematology ROS (+)   Anesthesia Other Findings   Reproductive/Obstetrics                             Anesthesia Physical Anesthesia Plan  ASA: 2  Anesthesia Plan: General   Post-op Pain Management:    Induction: Intravenous  PONV Risk Score and Plan: 3 and Ondansetron, Dexamethasone and Midazolam  Airway Management Planned: Oral ETT  Additional Equipment: None  Intra-op Plan:   Post-operative Plan: Extubation in OR  Informed Consent: I have reviewed the patients History and Physical, chart, labs and discussed the procedure including the risks, benefits and alternatives for the proposed anesthesia with the patient or authorized representative who has indicated his/her understanding and acceptance.     Dental advisory given  Plan Discussed with: CRNA  Anesthesia Plan Comments:         Anesthesia Quick Evaluation

## 2021-10-11 NOTE — Transfer of Care (Signed)
Immediate Anesthesia Transfer of Care Note  Patient: Brenda Ortega  Procedure(s) Performed: Microlumbar decompression Lumbar four -five, Lumabr five-Sacral one (Spine Lumbar)  Patient Location: PACU  Anesthesia Type:General  Level of Consciousness: drowsy and patient cooperative  Airway & Oxygen Therapy: Patient Spontanous Breathing and Patient connected to nasal cannula oxygen  Post-op Assessment: Report given to RN and Post -op Vital signs reviewed and stable  Post vital signs: Reviewed and stable  Last Vitals:  Vitals Value Taken Time  BP 139/75 10/11/21 1007  Temp    Pulse 81 10/11/21 1008  Resp 10 10/11/21 1008  SpO2 98 % 10/11/21 1008  Vitals shown include unvalidated device data.  Last Pain:  Vitals:   10/11/21 0605  TempSrc:   PainSc: 7       Patients Stated Pain Goal: 2 (10/11/21 5051)  Complications: No notable events documented.

## 2021-10-11 NOTE — Interval H&P Note (Signed)
History and Physical Interval Note:  10/11/2021 7:22 AM  Brenda Ortega  has presented today for surgery, with the diagnosis of Stenosis L4-5, L5-S1.  The various methods of treatment have been discussed with the patient and family. After consideration of risks, benefits and other options for treatment, the patient has consented to  Procedure(s): Microlumbar decompression L4-5, L5-S1 (N/A) as a surgical intervention.  The patient's history has been reviewed, patient examined, no change in status, stable for surgery.  I have reviewed the patient's chart and labs.  Questions were answered to the patient's satisfaction.     Javier Docker

## 2021-10-11 NOTE — Op Note (Signed)
Brenda Ortega, KLANG MEDICAL RECORD NO: [id number] ACCOUNT NO: [id number] DATE OF BIRTH: Apr 22, 1956 FACILITY: [facility] LOCATION: [facility] PHYSICIAN: Javier Docker, MD  Operative Report   DATE OF PROCEDURE: 10/11/2021   PREOPERATIVE DIAGNOSIS:  Spinal stenosis at L4-L5 and L5-S1.  POSTOPERATIVE DIAGNOSIS:  Spinal stenosis at L4-5 and L5-S1.  PROCEDURE PERFORMED:   1.  Bilateral hemilaminotomy L4-L5 and L5-S1. 2.  Foraminotomies bilaterally at L4, L5, S1  ANESTHESIA:  General.  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  MRI indicating severe spinal stenosis at L4-L5 centrally and lateral recess.  She also had lateral recess stenosis, L5-S1, moderate  stenosis to severe stenosis centrally at L5-S1.  Indicated for decompression of both levels.  Risks and benefits were discussed including bleeding, infection, damage to neurovascular structures, no change in symptoms, worsening symptoms, DVT, PE,  anesthetic complications, etc.  TECHNIQUE:  The patient in supine position.  After induction of adequate general anesthesia and vancomycin and Kefzol for antimicrobial prophylaxis, she was placed prone on the Wilson frame.  Foley to gravity.  Lumbar region was prepped and draped in the  usual sterile fashion.  Two 18-gauge spinal needle was utilized to localize a 4-5, 5-1 interspace confirmed with x-ray.  Incision was made from the spinous process of L4-S1.  Subcutaneous tissue was dissected, electrocautery was utilized to achieve  hemostasis.  Dorsal lumbar fascia divided in line with skin incision.  Paraspinous muscles elevated from the lamina, L4-L5 and L5-S1.  0.25% Marcaine with epinephrine had been infiltrated in subcutaneous tissue.  Next, McCulloch retractor was placed.  Operating microscope was draped and brought in the surgical field.  Confirmatory radiographs obtained with Kochers on  the spinous processes of L4 and S1.  There was a small interlaminar windows bilaterally at L4-L5  and L5-S1 and interposition of the spinous processes.  I therefore removed the spinous processes of L4, L5, and partially of S1.  This with a Leksell rongeur bone wax was placed on the cancellous surfaces.  First, the ligamentum flavum, which was  hypertrophic at 4-5.  We detached ligamentum flavum from the cephalad edge of lamina of L5 with a straight curette.  I performed hemilaminotomies of the caudad edge of L4 bilaterally with a 3 and then 2 mm Kerrison cephalad to the detachment point of the  ligamentum flavum.  At this point, centrally I used a Penfield to enter the spinal canal epidural fat was encountered.  I then placed a Woodson retractor beneath the ligamentum flavum and developed the plane between the thecal sac and the ligamentum  flavum.  I then began removing ligamentum flavum from the interspace.  This laterally left and right to detach the caudad edge of 4 bilaterally.  I then removed it essentially again protecting the thecal sac.  Performed inter laminotomies and cephalad  edge of L5 bilaterally, protecting the dura with neuro patties and a Woodson retractor.  On both sides, I decompressed the lateral recess to the medial border of the pedicle.  Severe stenosis was noted centrally and laterally.  I performed foraminotomies  of L4 and L5 bilaterally.  Good restoration of the thecal sac.  With the retractor passed freely cephalad to the foramen of L4 and L5 bilaterally.  No disk herniation was noted.  Bone wax cancellous surfaces were copiously irrigated with antibiotic  irrigation.  This was covered with a neuro patty.  Attention was turned down to  5-1 again another small interlaminar window.  Hemilaminotomy was performed of the caudad edge of L5 bilaterally to the point detaching the ligamentum flavum.  There was  severe hypertrophic ligamentum centrally.  The spinal canal was entered with a  Penfield taking epidural fat developed a plane between the thecal sac and the ligamentum flavum with a Facilities manager.  I then removed the ligamentum flavum from the  interspace centrally then detaching it from the cephalad edge of S1.  With a neuro patty and a Woodson retractor and the thecal sac we performed a hemilaminotomy of the cephalad edge of S1 bilaterally and a foraminotomy of S1.  I then decompressed the  lateral recess to the medial border of the pedicle with stenosis bilaterally.  Epidural venous plexus was noted and cauterized.  No disk herniation was noted at 5-1 on either side.  Following this was good restoration of the thecal sac and it was  retracted passed freely out the foramen of L5 and S1, below the pedicle of S1 and cephalad underneath the remaining lamina of L5.  Copiously irrigated with antibiotic irrigation.  I placed thrombin-soaked Gelfoam two small 2 mm x 2 mm portions both at  L4-L5 and L5-S1 laterally.  Bone wax was placed in all cancellous surfaces.  We performed a Valsalva at 40.  There was no evidence of active bleeding or CSF leakage.  I copiously irrigated it.  I then removed the McCulloch retractor after a confirmatory  radiograph obtained with [facility] retractors and foramen L4 and S1.  Copiously irrigated again.  I removed the [facility] retractor.  I inspected the paraspinous musculature and a cautery was utilized to achieve hemostasis.  Dorsal lumbar fascia was then  reapproximated with 1 Vicryl in interrupted figure-of-eight sutures with a small aperture distally to allow for any drainage that did occur.  There was no active bleeding, subcutaneous with 2-0 and skin with staples.  The wound was dressed sterilely,  placed supine on the hospital bed, extubated without difficulty and transported to the recovery room in satisfactory condition.  The patient tolerated the procedure well.  No complications.    ASSISTANT:  Andrez Grime, PA was used throughout the case  for patient positioning, gentle intermittent retraction, suction and closure.   BLOOD LOSS:  100 mL.     Xaver.Mink D: 10/11/2021 10:10:37 am T: 10/11/2021 11:05:00 am  JOB: [id number]/ [id number]

## 2021-10-11 NOTE — Interval H&P Note (Deleted)
History and Physical Interval Note:  10/11/2021 7:22 AM  Brenda Ortega  has presented today for surgery, with the diagnosis of Stenosis L4-5, L5-S1.  The various methods of treatment have been discussed with the patient and family. After consideration of risks, benefits and other options for treatment, the patient has consented to  Procedure(s): Microlumbar decompression L4-5, L5-S1 (N/A) as a surgical intervention.  The patient's history has been reviewed, patient examined, no change in status, stable for surgery.  I have reviewed the patient's chart and labs.  Questions were answered to the patient's satisfaction.     Javier Docker

## 2021-10-11 NOTE — Brief Op Note (Signed)
10/11/2021  10:02 AM  PATIENT:  Brenda Ortega  65 y.o. female  PRE-OPERATIVE DIAGNOSIS:  Stenosis Lumbar four-five, Lumabr five-Sacral one  POST-OPERATIVE DIAGNOSIS:  Stenosis Lumbar four-five, Lumabr five-Sacral one  PROCEDURE:  Procedure(s): Microlumbar decompression Lumbar four -five, Lumabr five-Sacral one (N/A)  SURGEON:  Surgeon(s) and Role:    * Jene Every, MD - Primary  PHYSICIAN ASSISTANT:   ASSISTANTS: Bissell   ANESTHESIA:   general  EBL:  100 mL   BLOOD ADMINISTERED:none  DRAINS: none   LOCAL MEDICATIONS USED:  MARCAINE     SPECIMEN:  No Specimen  DISPOSITION OF SPECIMEN:  N/A  COUNTS:  YES  TOURNIQUET:  * No tourniquets in log *  DICTATION: .Other Dictation: Dictation Number [id number]  PLAN OF CARE: Admit for overnight observation  PATIENT DISPOSITION:  PACU - hemodynamically stable.   Delay start of Pharmacological VTE agent (>24hrs) due to surgical blood loss or risk of bleeding: yes

## 2021-10-11 NOTE — Anesthesia Procedure Notes (Signed)
Procedure Name: Intubation Date/Time: 10/11/2021 7:43 AM Performed by: Moshe Salisbury, CRNA Pre-anesthesia Checklist: Patient identified, Emergency Drugs available, Suction available and Patient being monitored Patient Re-evaluated:Patient Re-evaluated prior to induction Oxygen Delivery Method: Circle System Utilized Preoxygenation: Pre-oxygenation with 100% oxygen Induction Type: IV induction Ventilation: Mask ventilation without difficulty Laryngoscope Size: Mac and 4 Grade View: Grade II Tube type: Oral Tube size: 7.5 mm Number of attempts: 1 Airway Equipment and Method: Stylet and Oral airway Placement Confirmation: ETT inserted through vocal cords under direct vision, positive ETCO2 and breath sounds checked- equal and bilateral Secured at: 22 cm Tube secured with: Tape Dental Injury: Teeth and Oropharynx as per pre-operative assessment

## 2021-10-11 NOTE — Evaluation (Signed)
Physical Therapy Evaluation Patient Details Name: Brenda Ortega MRN: [id number] DOB: [birth year] Today's Date: 10/11/2021  History of Present Illness  Pt is a 65 y/o female s/p L4-S1 microlumbar decompression on 11/3. PMH includes appendectomy.  Clinical Impression  Patient is s/p above surgery resulting in the deficits listed below (see PT Problem List). Pt with increased dizziness during mobility tasks which limited gait distance. Min guard to min A and HHA throughout. Educated about back precautions and generalized walking program. Educated about using RW at home; pt reports she has one. Anticipate pt will progress well once dizziness improves.  Patient will benefit from skilled PT to increase their independence and safety with mobility (while adhering to their precautions) to allow discharge to the venue listed below.        Recommendations for follow up therapy are one component of a multi-disciplinary discharge planning process, led by the attending physician.  Recommendations may be updated based on patient status, additional functional criteria and insurance authorization.  Follow Up Recommendations No PT follow up    Assistance Recommended at Discharge Intermittent Supervision/Assistance  Functional Status Assessment Patient has had a recent decline in their functional status and demonstrates the ability to make significant improvements in function in a reasonable and predictable amount of time.  Equipment Recommendations  Rolling walker (2 wheels) (if she doesn't have one)    Recommendations for Other Services       Precautions / Restrictions Precautions Precautions: Back Precaution Booklet Issued: Yes (comment) Precaution Comments: Reviewed back precautions with pt. Restrictions Weight Bearing Restrictions: No      Mobility  Bed Mobility Overal bed mobility: Needs Assistance Bed Mobility: Rolling;Sidelying to Sit;Sit to Sidelying Rolling: Min guard Sidelying to sit: Min  guard     Sit to sidelying: Min guard General bed mobility comments: Min guard for cues to use log roll technique. Increased time required.    Transfers Overall transfer level: Needs assistance Equipment used: 1 person hand held assist Transfers: Sit to/from Stand Sit to Stand: Min assist           General transfer comment: Min A for steadying assist.    Ambulation/Gait Ambulation/Gait assistance: Min guard;Min assist Gait Distance (Feet): 75 Feet Assistive device: IV Pole;1 person hand held assist Gait Pattern/deviations: Step-through pattern;Decreased stride length Gait velocity: Decreased   General Gait Details: Very slow, guarded gait. Pt with increased dizziness so mobility limited. Min guard to min A for steadying. Educated about using RW at home and educated about walking program to perform at home.  Stairs            Wheelchair Mobility    Modified Rankin (Stroke Patients Only)       Balance Overall balance assessment: Needs assistance Sitting-balance support: No upper extremity supported;Feet supported Sitting balance-Leahy Scale: Fair     Standing balance support: Single extremity supported;Bilateral upper extremity supported Standing balance-Leahy Scale: Poor Standing balance comment: Reliant on at least 1 UE support                             Pertinent Vitals/Pain Pain Assessment: Faces Faces Pain Scale: Hurts even more Pain Location: back Pain Descriptors / Indicators: Grimacing;Guarding Pain Intervention(s): Limited activity within patient's tolerance;Monitored during session;Repositioned    Home Living Family/patient expects to be discharged to:: Private residence Living Arrangements: Spouse/significant other Available Help at Discharge: Family Type of Home: House Home Access: Stairs to enter Entrance Stairs-Rails: None Entrance [employer]  of Steps: 3   Home Layout: One level Home Equipment: Agricultural consultant (2  wheels);Shower seat      Prior Function Prior Level of Function : Independent/Modified Independent             Mobility Comments: Reports occasionally using RW       Hand Dominance        Extremity/Trunk Assessment   Upper Extremity Assessment Upper Extremity Assessment: Defer to OT evaluation    Lower Extremity Assessment Lower Extremity Assessment: Generalized weakness    Cervical / Trunk Assessment Cervical / Trunk Assessment: Back Surgery  Communication   Communication: No difficulties  Cognition Arousal/Alertness: Awake/alert Behavior During Therapy: WFL for tasks assessed/performed Overall Cognitive Status: Within Functional Limits for tasks assessed                                          General Comments      Exercises     Assessment/Plan    PT Assessment Patient needs continued PT services  PT Problem List Decreased strength;Decreased activity tolerance;Decreased balance;Decreased mobility;Decreased knowledge of use of DME;Decreased knowledge of precautions;Pain       PT Treatment Interventions DME instruction;Gait training;Functional mobility training;Therapeutic activities;Therapeutic exercise;Balance training;Patient/family education    PT Goals (Current goals can be found in the Care Plan section)  Acute Rehab PT Goals Patient Stated Goal: to feel better PT Goal Formulation: With patient Time For Goal Achievement: 10/25/21 Potential to Achieve Goals: Good    Frequency Min 5X/week   Barriers to discharge        Co-evaluation               AM-PAC PT "6 Clicks" Mobility  Outcome Measure Help needed turning from your back to your side while in a flat bed without using bedrails?: A Little Help needed moving from lying on your back to sitting on the side of a flat bed without using bedrails?: A Little Help needed moving to and from a bed to a chair (including a wheelchair)?: A Little Help needed standing up from a  chair using your arms (e.g., wheelchair or bedside chair)?: A Little Help needed to walk in hospital room?: A Little Help needed climbing 3-5 steps with a railing? : A Lot 6 Click Score: 17    End of Session Equipment Utilized During Treatment: Gait belt Activity Tolerance: Treatment limited secondary to medical complications (Comment) (dizziness) Patient left: in bed;with call bell/phone within reach Nurse Communication: Mobility status PT Visit Diagnosis: Unsteadiness on feet (R26.81);Muscle weakness (generalized) (M62.81);Pain Pain - part of body:  (back)    Time: 1352-1410 PT Time Calculation (min) (ACUTE ONLY): 18 min   Charges:   PT Evaluation $PT Eval Low Complexity: 1 Low          Cindee Salt, DPT  Acute Rehabilitation Services  Pager: [phone] Office: [phone]   Lehman Prom 10/11/2021, 2:21 PM

## 2021-10-12 ENCOUNTER — Encounter (HOSPITAL_COMMUNITY): Payer: Self-pay | Admitting: Specialist

## 2021-10-12 MED FILL — Thrombin For Soln Kit 20000 Unit: CUTANEOUS | Qty: 1 | Status: AC

## 2022-08-01 IMAGING — DX DG CERVICAL SPINE COMPLETE 4+V
5 series · 5 of 5 positions shown · non-contrast
Comparison: None.

CLINICAL DATA: Neck pain

EXAM:
CERVICAL SPINE - COMPLETE 4+ VIEW

[c-spine lat]
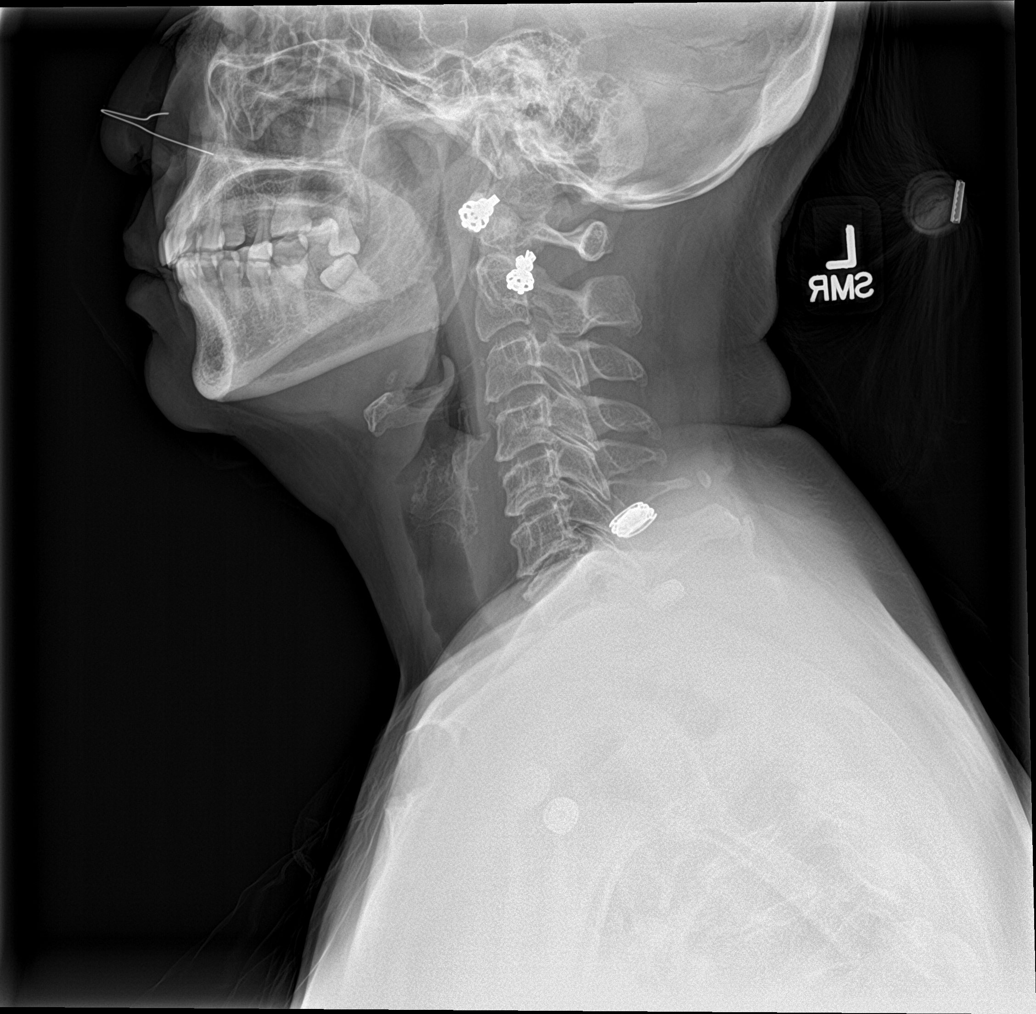

[c-spine obl (1 of 2)]
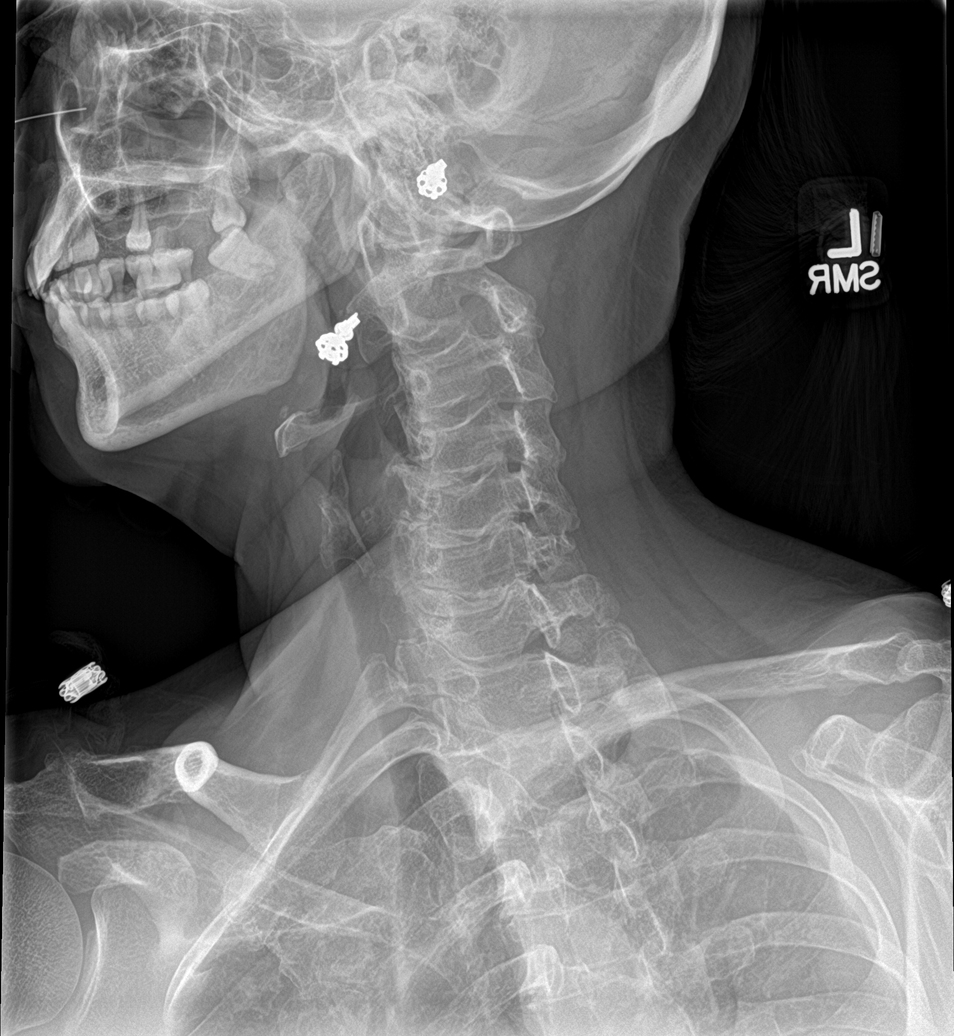

[c-spine obl (2 of 2)]
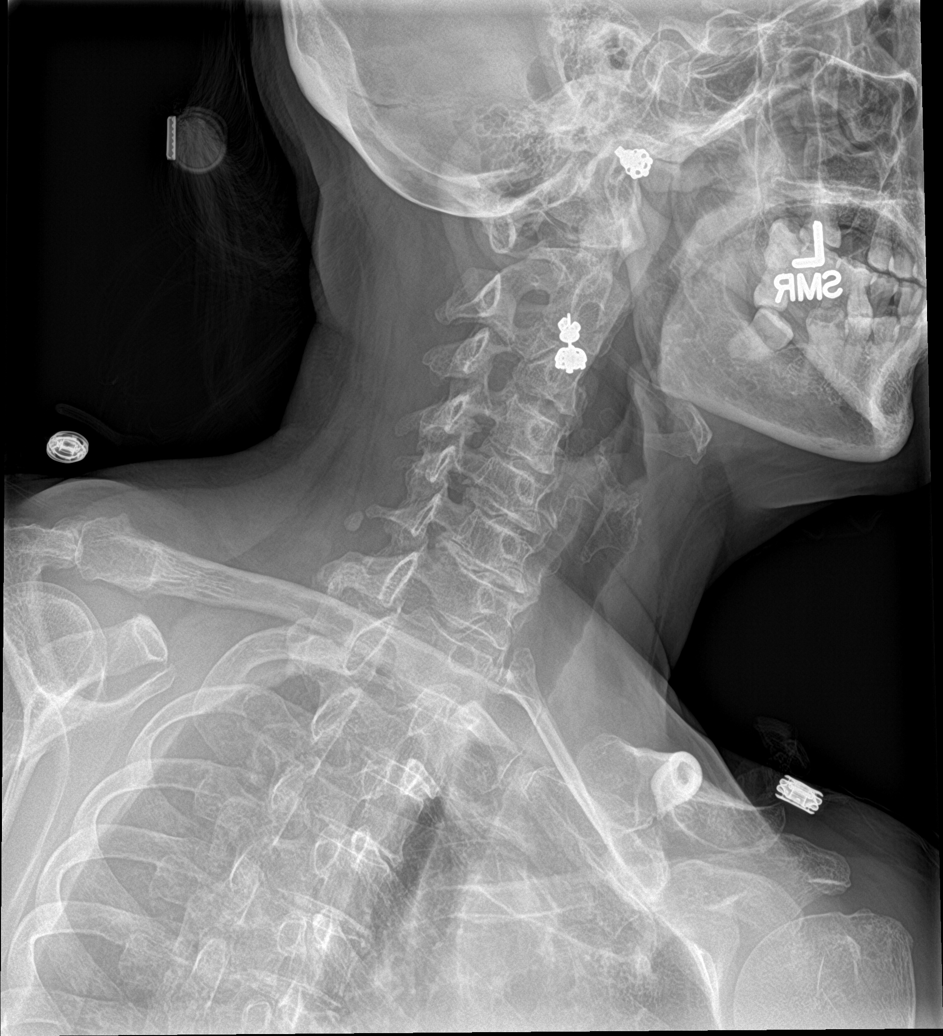

[c-spine ap]
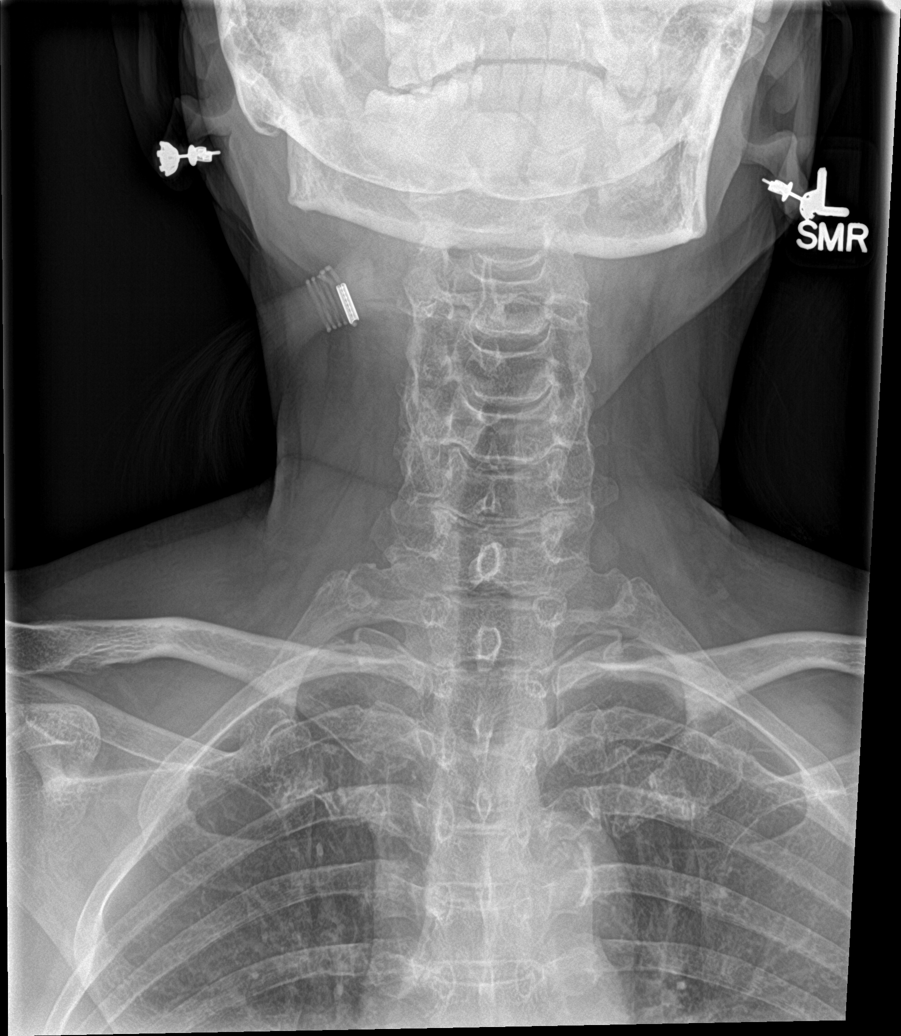

[c-spine open mouth]
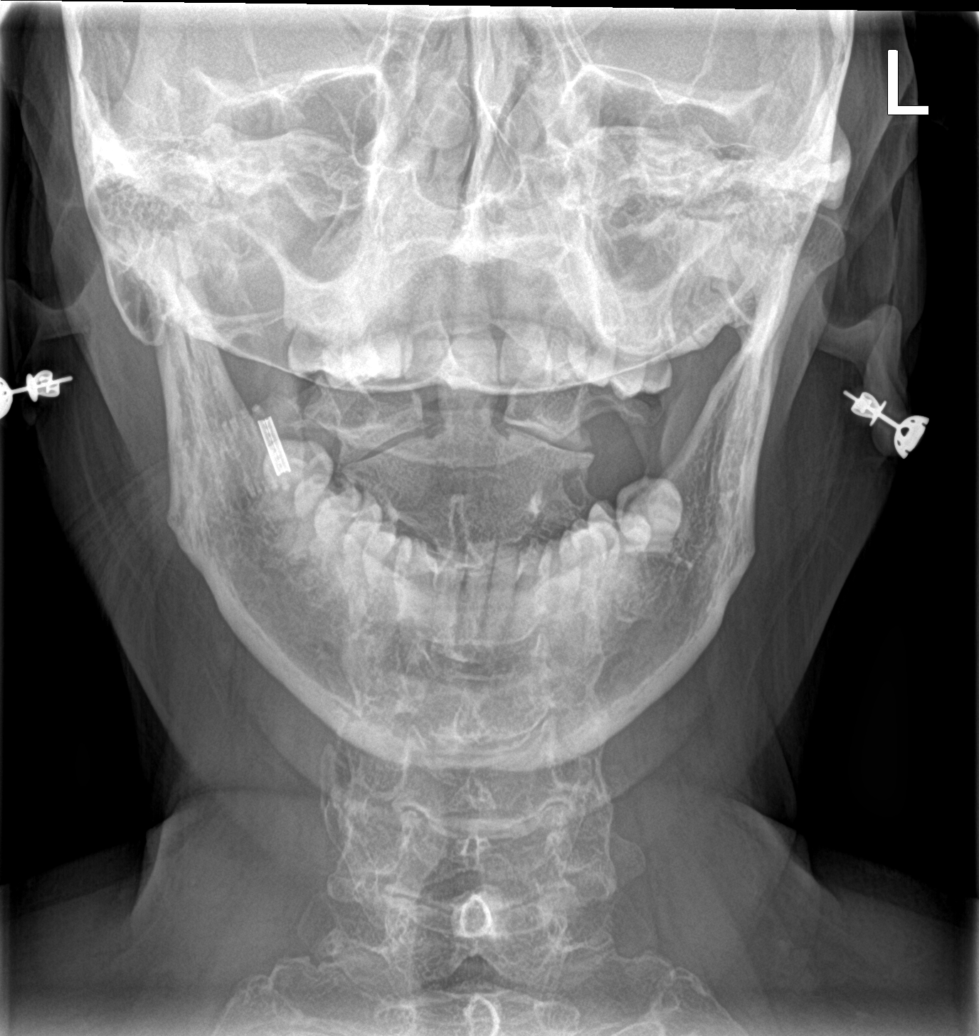

[5 of 5 positions shown; findings below may reference images not displayed]

FINDINGS: Seven cervical segments are well visualized. Vertebral body height
is well maintained. Osteophytic changes are noted particularly at
C5-6 and C6-7. Mild facet hypertrophic changes are noted. Neural
foramina are patent bilaterally with the exception of mild narrowing
at C5-6 and C6-7. The odontoid is within normal limits. No soft
tissue abnormality is seen.
IMPRESSION: Degenerative change without acute abnormality.

## 2023-03-04 ENCOUNTER — Other Ambulatory Visit: Payer: Self-pay

## 2023-03-04 ENCOUNTER — Observation Stay (HOSPITAL_COMMUNITY): Payer: BC Managed Care – PPO

## 2023-03-04 ENCOUNTER — Encounter (HOSPITAL_COMMUNITY): Payer: Self-pay

## 2023-03-04 ENCOUNTER — Emergency Department (HOSPITAL_COMMUNITY): Payer: BC Managed Care – PPO

## 2023-03-04 ENCOUNTER — Observation Stay (HOSPITAL_COMMUNITY)
Admission: EM | Admit: 2023-03-04 | Discharge: 2023-03-05 | Disposition: A | Discharge status: Home / Self Care | Payer: BC Managed Care – PPO | Attending: Internal Medicine | Admitting: Internal Medicine

## 2023-03-04 DIAGNOSIS — Z7982 Long term (current) use of aspirin: Secondary | ICD-10-CM | POA: Insufficient documentation

## 2023-03-04 DIAGNOSIS — E039 Hypothyroidism, unspecified: Secondary | ICD-10-CM | POA: Insufficient documentation

## 2023-03-04 DIAGNOSIS — N390 Urinary tract infection, site not specified: Secondary | ICD-10-CM | POA: Diagnosis present

## 2023-03-04 DIAGNOSIS — R946 Abnormal results of thyroid function studies: Secondary | ICD-10-CM | POA: Insufficient documentation

## 2023-03-04 DIAGNOSIS — R42 Dizziness and giddiness: Principal | ICD-10-CM

## 2023-03-04 DIAGNOSIS — I639 Cerebral infarction, unspecified: Secondary | ICD-10-CM | POA: Diagnosis not present

## 2023-03-04 DIAGNOSIS — I110 Hypertensive heart disease with heart failure: Secondary | ICD-10-CM | POA: Diagnosis not present

## 2023-03-04 DIAGNOSIS — E7800 Pure hypercholesterolemia, unspecified: Secondary | ICD-10-CM | POA: Diagnosis not present

## 2023-03-04 DIAGNOSIS — E782 Mixed hyperlipidemia: Secondary | ICD-10-CM | POA: Diagnosis present

## 2023-03-04 DIAGNOSIS — I5032 Chronic diastolic (congestive) heart failure: Secondary | ICD-10-CM | POA: Diagnosis not present

## 2023-03-04 DIAGNOSIS — Z79899 Other long term (current) drug therapy: Secondary | ICD-10-CM | POA: Insufficient documentation

## 2023-03-04 DIAGNOSIS — I10 Essential (primary) hypertension: Secondary | ICD-10-CM | POA: Diagnosis present

## 2023-03-04 DIAGNOSIS — R7989 Other specified abnormal findings of blood chemistry: Secondary | ICD-10-CM | POA: Diagnosis present

## 2023-03-04 DIAGNOSIS — E663 Overweight: Secondary | ICD-10-CM | POA: Diagnosis not present

## 2023-03-04 DIAGNOSIS — M48061 Spinal stenosis, lumbar region without neurogenic claudication: Secondary | ICD-10-CM | POA: Diagnosis present

## 2023-03-04 LAB — RAPID URINE DRUG SCREEN, HOSP PERFORMED
Amphetamines: NOT DETECTED
Barbiturates: NOT DETECTED
Benzodiazepines: NOT DETECTED
Cocaine: NOT DETECTED
Opiates: NOT DETECTED
Tetrahydrocannabinol: NOT DETECTED

## 2023-03-04 LAB — URINALYSIS, ROUTINE W REFLEX MICROSCOPIC
Bilirubin Urine: NEGATIVE
Glucose, UA: NEGATIVE mg/dL
Hgb urine dipstick: NEGATIVE
Ketones, ur: NEGATIVE mg/dL
Nitrite: NEGATIVE
Protein, ur: NEGATIVE mg/dL
Specific Gravity, Urine: 1.004 — ABNORMAL LOW (ref 1.005–1.030)
pH: 6 (ref 5.0–8.0)

## 2023-03-04 LAB — COMPREHENSIVE METABOLIC PANEL
ALT: 24 U/L (ref 0–44)
AST: 24 U/L (ref 15–41)
Albumin: 4.2 g/dL (ref 3.5–5.0)
Alkaline Phosphatase: 63 U/L (ref 38–126)
Anion gap: 10 (ref 5–15)
BUN: 16 mg/dL (ref 8–23)
CO2: 24 mmol/L (ref 22–32)
Calcium: 9.1 mg/dL (ref 8.9–10.3)
Chloride: 106 mmol/L (ref 98–111)
Creatinine, Ser: 0.72 mg/dL (ref 0.44–1.00)
GFR, Estimated: >60 mL/min (ref >60)
Glucose, Bld: 95 mg/dL (ref 70–99)
Potassium: 3.8 mmol/L (ref 3.5–5.1)
Sodium: 140 mmol/L (ref 135–145)
Total Bilirubin: 0.6 mg/dL (ref 0.3–1.2)
Total Protein: 8 g/dL (ref 6.5–8.1)

## 2023-03-04 LAB — CBC WITH DIFFERENTIAL/PLATELET
Abs Immature Granulocytes: 0.01 10*3/uL (ref 0.00–0.07)
Basophils Absolute: 0 10*3/uL (ref 0.0–0.1)
Basophils Relative: 1 %
Eosinophils Absolute: 0.1 10*3/uL (ref 0.0–0.5)
Eosinophils Relative: 2 %
HCT: 45.9 % (ref 36.0–46.0)
Hemoglobin: 15.3 g/dL — ABNORMAL HIGH (ref 12.0–15.0)
Immature Granulocytes: 0 %
Lymphocytes Relative: 43 %
Lymphs Abs: 2.5 10*3/uL (ref 0.7–4.0)
MCH: 30.8 pg (ref 26.0–34.0)
MCHC: 33.3 g/dL (ref 30.0–36.0)
MCV: 92.4 fL (ref 80.0–100.0)
Monocytes Absolute: 0.5 10*3/uL (ref 0.1–1.0)
Monocytes Relative: 8 %
Neutro Abs: 2.7 10*3/uL (ref 1.7–7.7)
Neutrophils Relative %: 46 %
Platelets: 334 10*3/uL (ref 150–400)
RBC: 4.97 MIL/uL (ref 3.87–5.11)
RDW: 12.6 % (ref 11.5–15.5)
WBC: 5.9 10*3/uL (ref 4.0–10.5)
nRBC: 0 % (ref 0.0–0.2)

## 2023-03-04 LAB — APTT: aPTT: 30 seconds (ref 24–36)

## 2023-03-04 LAB — MAGNESIUM: Magnesium: 2.3 mg/dL (ref 1.7–2.4)

## 2023-03-04 LAB — CBG MONITORING, ED: Glucose-Capillary: 107 mg/dL — ABNORMAL HIGH (ref 70–99)

## 2023-03-04 LAB — PROTIME-INR
INR: 1 (ref 0.8–1.2)
Prothrombin Time: 13 seconds (ref 11.4–15.2)

## 2023-03-04 MED ORDER — ASPIRIN 81 MG PO CHEW
324.0000 mg | CHEWABLE_TABLET | Freq: Once | ORAL | Status: AC
  Administered 2023-03-04: 324 mg via ORAL
  Filled 2023-03-04: qty 4

## 2023-03-04 MED ORDER — SODIUM CHLORIDE 0.9 % IV SOLN: INTRAVENOUS | Status: AC

## 2023-03-04 MED ORDER — IOHEXOL 350 MG/ML SOLN
80.0000 mL | Freq: Once | INTRAVENOUS | Status: AC | PRN
  Administered 2023-03-04: 80 mL via INTRAVENOUS

## 2023-03-04 MED ORDER — CLOPIDOGREL BISULFATE 75 MG PO TABS
75.0000 mg | ORAL_TABLET | Freq: Every day | ORAL | Status: DC
  Administered 2023-03-04 – 2023-03-05 (×2): 75 mg via ORAL
  Filled 2023-03-04 (×2): qty 1

## 2023-03-04 MED ORDER — ASPIRIN 81 MG PO TBEC
81.0000 mg | DELAYED_RELEASE_TABLET | Freq: Every day | ORAL | Status: DC
  Administered 2023-03-05: 81 mg via ORAL
  Filled 2023-03-04: qty 1

## 2023-03-04 NOTE — ED Provider Notes (Signed)
[facility] Provider Note   CSN: [id number] Arrival date & time: 03/04/23  1541     History  Chief Complaint  Patient presents with   Dizziness   Numbness    Brenda Ortega is a 67 y.o. female with cervical/lumbar radiculopathy, spinal stenosis, who presents with dizziness and facial numbness.   History obtained utilizing Burmese video interpreter.  Patient is here for evaluation of dizziness and right sided face numbness. Patient reports that this started two days ago Sunday at 10 am. Pt reports double vision as well.  Spinning sensation, sleepiness/fatigue. Right side of her face is tingling, right side of head/neck is painful. Had this happen once before several months ago and it went away on its own after 2 days.  No headache but endorses right sided facial tingling/pain. No pain with extraocular movements, no pain in the eyeball itself. Sunday and yesterday she had some double vision, like seeing a shadow of something she's looking at. Had binocular blurry vision yesterday but now it's gone. Vision seems normal now today. Also had a period of time of slurred speech on Sunday, word finding difficulty, but doesn't have any of that now either, just the R facial tingling/pain, maybe some R facial weakness/RUE weakness. Denies other symptoms like f/c, CP, SOB, abd pain. Endorses some urinary urgency.  No AC.    Dizziness      Home Medications Prior to Admission medications   Medication Sig Start Date End Date Taking? Authorizing Provider  cyclobenzaprine (FLEXERIL) 10 MG tablet Take 1 tablet (10 mg total) by mouth 2 (two) times daily as needed for muscle spasms. Patient not taking: Reported on 10/04/2021 05/08/21   Dorie Rank, MD  docusate sodium (COLACE) 100 MG capsule Take 1 capsule (100 mg total) by mouth 2 (two) times daily as needed for mild constipation. 10/11/21   Susa Day, MD  ondansetron (ZOFRAN) 4 MG tablet Take 1 tablet (4  mg total) by mouth every 6 (six) hours as needed for nausea or vomiting. 10/11/21   Cecilie Kicks, PA-C  oxyCODONE (OXY IR/ROXICODONE) 5 MG immediate release tablet Take 1 tablet (5 mg total) by mouth every 4 (four) hours as needed for severe pain. 10/11/21   Susa Day, MD  pantoprazole (PROTONIX) 20 MG tablet Take 20 mg by mouth in the morning.    [provider]  polyethylene glycol (MIRALAX / GLYCOLAX) 17 g packet Take 17 g by mouth daily. 10/11/21   Susa Day, MD      Allergies    Penicillins    Review of Systems   Review of Systems  Neurological:  Positive for dizziness.   Review of systems Negative for f/c, CP, SOB.  A 10 point review of systems was performed and is negative unless otherwise reported in HPI.  Physical Exam Updated Vital Signs BP (!) 145/85   Pulse 72   Temp 98 F (36.7 C) (Oral)   Resp 16   Ht 5\' 2"  (1.575 m)   Wt 67.1 kg   SpO2 98%   BMI 27.06 kg/m  Physical Exam General: Normal appearing female, lying in bed.  HEENT: PERRLA midrange, EOMI, no nystagmus, Sclera anicteric, MMM, trachea midline. Tongue protrudes midline. No tenderness over R temple. Cardiology: RRR, no murmurs/rubs/gallops. BL radial and DP pulses equal bilaterally.  Resp: Normal respiratory rate and effort. CTAB, no wheezes, rhonchi, crackles.  Abd: Soft, non-tender, non-distended. No rebound tenderness or guarding.  GU: Deferred. MSK: No peripheral  edema or signs of trauma. Extremities without deformity or TTP. No cyanosis or clubbing. Skin: warm, dry.  Neuro: A&Ox4, CNs II-XII grossly intact. 5/5 strength in all four extremities. Sensation grossly intact. Intact FTN bilaterally. No obvious slurred speech with interpreter. Psych: Normal mood and affect.   1a  Level of consciousness: 0=alert; keenly responsive  1b. LOC questions:  0=Performs both tasks correctly  1c. LOC commands: 0=Performs both tasks correctly  2.  Best Gaze: 0=normal  3.  Visual: 0=No visual  loss  4. Facial Palsy: 0=Normal symmetric movement  5a.  Motor left arm: 0=No drift, limb holds 90 (or 45) degrees for full 10 seconds  5b.  Motor right arm: 0=No drift, limb holds 90 (or 45) degrees for full 10 seconds  6a. motor left leg: 0=No drift, limb holds 90 (or 45) degrees for full 10 seconds  6b  Motor right leg:  0=No drift, limb holds 90 (or 45) degrees for full 10 seconds  7. Limb Ataxia: 0=Absent  8.  Sensory: 0=Normal; no sensory loss  9. Best Language:  0=No aphasia, normal  10. Dysarthria: 0=Normal  11. Extinction and Inattention: 0=No abnormality   Total:   0        ED Results / Procedures / Treatments   Labs (all labs ordered are listed, but only abnormal results are displayed) Labs Reviewed  CBC WITH DIFFERENTIAL/PLATELET - Abnormal; Notable for the following components:      Result Value   Hemoglobin 15.3 (*)    All other components within normal limits  URINALYSIS, ROUTINE W REFLEX MICROSCOPIC - Abnormal; Notable for the following components:   Color, Urine STRAW (*)    Specific Gravity, Urine 1.004 (*)    Leukocytes,Ua MODERATE (*)    Bacteria, UA MANY (*)    All other components within normal limits  CBG MONITORING, ED - Abnormal; Notable for the following components:   Glucose-Capillary 107 (*)    All other components within normal limits  COMPREHENSIVE METABOLIC PANEL  MAGNESIUM  PROTIME-INR  APTT  RAPID URINE DRUG SCREEN, HOSP PERFORMED  HEMOGLOBIN A1C  LIPID PANEL  TSH    EKG EKG Interpretation  Date/Time:  Tuesday March 04 2023 16:13:32 EDT Ventricular Rate:  78 PR Interval:  195 QRS Duration: 78 QT Interval:  381 QTC Calculation: 434 R Axis:   -25 Text Interpretation: Sinus rhythm Borderline left axis deviation Confirmed by Cindee Lame [phone]) on 03/04/2023 5:08:33 PM  Radiology CT Head Wo Contrast  Result Date: 03/04/2023 CLINICAL DATA:  Dizziness and right-sided facial numbness EXAM: CT HEAD WITHOUT CONTRAST TECHNIQUE:  Contiguous axial images were obtained from the base of the skull through the vertex without intravenous contrast. RADIATION DOSE REDUCTION: This exam was performed according to the departmental dose-optimization program which includes automated exposure control, adjustment of the mA and/or kV according to patient size and/or use of iterative reconstruction technique. COMPARISON:  05/10/2009 FINDINGS: Brain: Hypodensity in the anterior limb of the left internal capsule is new from the prior exam. No evidence of acute cortical infarction, hemorrhage, mass, mass effect, or midline shift. No hydrocephalus or extra-axial fluid collection. Periventricular white matter changes, likely the sequela of chronic small vessel ischemic disease. Vascular: No hyperdense vessel. Skull: Negative for fracture or focal lesion. Sinuses/Orbits: No acute finding. Other: The mastoid air cells are well aerated. IMPRESSION: 1. Hypodensity in the anterior limb of the left internal capsule is new from the prior exam, and could represent an age-indeterminate infarct. MRI could be considered  for further evaluation. 2. No acute cortical infarction. These results were called by telephone at the time of interpretation on 03/04/2023 at 4:51 pm to provider Elasha Tess , who verbally acknowledged these results. Electronically Signed   By: Merilyn Baba M.D.   On: 03/04/2023 16:51    Procedures Procedures    Medications Ordered in ED Medications  0.9 %  sodium chloride infusion (has no administration in time range)  aspirin chewable tablet 324 mg (has no administration in time range)  aspirin EC tablet 81 mg (has no administration in time range)  clopidogrel (PLAVIX) tablet 75 mg (has no administration in time range)    ED Course/ Medical Decision Making/ A&P                          Medical Decision Making Amount and/or Complexity of Data Reviewed Labs: ordered. Decision-making details documented in ED Course. Radiology: ordered.  Decision-making details documented in ED Course.  Risk Decision regarding hospitalization.    This patient presents to the ED for concern of facial tingling/pain, this involves an extensive number of treatment options, and is a complaint that carries with it a high risk of complications and morbidity.  I considered the following differential and admission for this acute, potentially life threatening condition.   MDM:    Given the acute onset of neurological symptoms, stroke is the most concerning etiology of these acute symptoms. The neuro exam is significant for subjective tingling of R face. Also consider vascular etiology such as carotid dissection, carotid sinus thrombosis, electrolyte derangement, hypo/hyperglycemia, cervical radiculopathy, TGN, TIA given slurred speech/double vision that resolved. No tenderness over temple to suggest GCA. No ocular pain to suggest orbital cellulitis and pupils are reactive, no injection to suggest AACG.  LKN: Sunday 10 am Glucose: 107 mg/dL AC: no BP: 145/85   Plan: - stat head CT, consider additional imaging including CTA and perfusion scan pending initial CT  - neurology has been consulted  - considered TNK but not within window, NIHSS 0 - Manage hypertension as needed -- sBP goal 140-160 if ICH - labs & other orders as below   Clinical Course as of 03/04/23 1832  Tue Mar 04, 2023  1707 CBC with Differential(!) unremarkable [HN]  1707 Magnesium: 2.3 [HN]  1707 Comprehensive metabolic panel wnl [HN]  [id number] Glucose-Capillary(!): 107 [HN]  1708 CT Head Wo Contrast IMPRESSION: 1. Hypodensity in the anterior limb of the left internal capsule is new from the prior exam, and could represent an age-indeterminate infarct. MRI could be considered for further evaluation. 2. No acute cortical infarction.  These results were called by telephone at the time of interpretation on 03/04/2023 at 4:51 pm to provider Mykira Hofmeister , who verbally  acknowledged these results.   [HN]    Clinical Course User Index [HN] Audley Hose, MD    Labs: I Ordered, and personally interpreted labs.  The pertinent results include:  those listed above  Imaging Studies ordered: I ordered imaging studies including CTH, CTA, MRI brain I independently visualized and interpreted imaging. I agree with the radiologist interpretation  Additional history obtained from chart review.   Cardiac Monitoring: The patient was maintained on a cardiac monitor.  I personally viewed and interpreted the cardiac monitored which showed an underlying rhythm of: NSr  Reevaluation: After the interventions noted above, I reevaluated the patient and found that they have :stayed the same  Social Determinants of Health: Patient lives independently  with husband  Disposition:  CTH shows possible CVA in L internal capsule. CTA H&N/MRI brain wo contrast ordered. D/w neurology who recommends MRI brain/admission to hospitalist. Admitted to hospitalist for stroke w/u.    Co morbidities that complicate the patient evaluation History reviewed. No pertinent past medical history.   Medicines Meds ordered this encounter  Medications   0.9 %  sodium chloride infusion   aspirin chewable tablet 324 mg   aspirin EC tablet 81 mg   clopidogrel (PLAVIX) tablet 75 mg    I have reviewed the patients home medicines and have made adjustments as needed  Problem List / ED Course: Problem List Items Addressed This Visit       Cardiovascular and Mediastinum   * (Principal) Stroke [facility]) - Primary   Relevant Medications   aspirin chewable tablet 324 mg   aspirin EC tablet 81 mg (Start on 03/05/2023 10:00 AM)                This note was created using dictation software, which may contain spelling or grammatical errors.    Audley Hose, MD 03/04/23 [phone]

## 2023-03-04 NOTE — Assessment & Plan Note (Addendum)
-   currently asymptomatic; supportive care - resume home meds if needed; no chronic opioids noted on database

## 2023-03-04 NOTE — Assessment & Plan Note (Addendum)
Initial CT findings concerning for possible acute CVA, however MRI confirmed no evidence of CVA.  Neurology saw patient and given elevated TSH and positive urinalysis, felt symptoms could be otherwise attributed to this.  That said, advised patient plan will be to treat underlying urinary tract infection and repeat thyroid function studies.  If symptoms are persisting after treatment, to follow-up with her PCP sooner than 1 month when scheduled.

## 2023-03-04 NOTE — H&P (Signed)
History and Physical    [employer]  [id number]  DOB: 03-18-1956  DOA: 03/04/2023  PCP: Velna Hatchet, MD [facility]  Chief Complaint: right face "numbess" and dizziness   HPI:  Ms. Lamkins is a 67 yo Burmese speaking female with PMH HLD, osteopenia, GERD, arthritis, lumbar spinal stenosis who presented to the ER with complaints of right facial numbness and persistent dizziness.  She denies any falling from her dizziness but states symptoms began around 10 AM on Sunday morning and have mildly improved but remain present.  She finally presented to the ER for further evaluation due to this.  She denied any symptoms of similar in the past although endorses feeling sleepy/tired approximately 2 months ago which was abnormal for her.  She initially presented to urgent care and was referred to the ER due to her symptoms.  CT head was performed in the ER which showed hypodensity involving the anterior limb of the left IC concerning for underlying age-indeterminate infarct. She is admitted for further stroke workup.  I have personally briefly reviewed patient's old medical records in [facility] and discussed patient with the ER provider when appropriate/indicated.  Assessment and Plan: * Stroke [facility]) - High suspicion for acute/subacute CVA given symptoms of dizziness, right facial paresthesia, and right-sided mild weakness -CT head notes hypodensity involving anterior limb of left internal capsule, age-indeterminate -Discussed with neurology, patient to transfer to [facility] for further stroke neurology evaluation -Follow-up CTA head/neck and MRI brain - continue on tele - Echo ordered - Follow-up lipid, TSH, A1c -Start aspirin/Plavix and follow-up further neurology recommendations  Spinal stenosis at L4-L5 level - currently asymptomatic; supportive care - resume home meds if needed; no chronic opioids noted on database   Code Status:     Code Status: Full Code  DVT Prophylaxis:  Lovenox     Anticipated disposition is to: Home 1-2 days  History: History reviewed. No pertinent past medical history.  Past Surgical History:  Procedure Laterality Date   APPENDECTOMY  1998   LUMBAR LAMINECTOMY/DECOMPRESSION MICRODISCECTOMY N/A 10/11/2021   Procedure: Microlumbar decompression Lumbar four -five, Lumabr five-Sacral one;  Surgeon: Susa Day, MD;  Location: [facility];  Service: Orthopedics;  Laterality: N/A;     reports that she has never smoked. She has never used smokeless tobacco. She reports that she does not drink alcohol and does not use drugs.  Allergies  Allergen Reactions   Penicillins Itching and Rash    History reviewed. No pertinent family history.  Home Medications: Prior to Admission medications   Medication Sig Start Date End Date Taking? Authorizing Provider  cyclobenzaprine (FLEXERIL) 10 MG tablet Take 1 tablet (10 mg total) by mouth 2 (two) times daily as needed for muscle spasms. Patient not taking: Reported on 10/04/2021 05/08/21   Dorie Rank, MD  docusate sodium (COLACE) 100 MG capsule Take 1 capsule (100 mg total) by mouth 2 (two) times daily as needed for mild constipation. 10/11/21   Susa Day, MD  ondansetron (ZOFRAN) 4 MG tablet Take 1 tablet (4 mg total) by mouth every 6 (six) hours as needed for nausea or vomiting. 10/11/21   Cecilie Kicks, PA-C  oxyCODONE (OXY IR/ROXICODONE) 5 MG immediate release tablet Take 1 tablet (5 mg total) by mouth every 4 (four) hours as needed for severe pain. 10/11/21   Susa Day, MD  pantoprazole (PROTONIX) 20 MG tablet Take 20 mg by mouth in the morning.    [provider]  polyethylene glycol (MIRALAX /  GLYCOLAX) 17 g packet Take 17 g by mouth daily. 10/11/21   Susa Day, MD    Review of Systems:  Review of Systems  Constitutional: Negative.   HENT: Negative.    Eyes: Negative.   Respiratory: Negative.    Cardiovascular: Negative.   Gastrointestinal: Negative.   Genitourinary:  Negative.   Musculoskeletal: Negative.   Skin: Negative.   Neurological:  Positive for dizziness.  Endo/Heme/Allergies: Negative.   Psychiatric/Behavioral: Negative.      Physical Exam:  Vitals:   03/04/23 1800 03/04/23 1830 03/04/23 1845 03/04/23 1852  BP: 134/84 (!) 143/102 (!) 141/87 136/85  Pulse: 76 72 80 80  Resp: (!) 22 17 (!) 23 17  Temp:    98.1 F (36.7 C)  TempSrc:    Oral  SpO2: 99% 98% 95% 97%  Weight:      Height:       Physical Exam Constitutional:      General: She is not in acute distress.    Appearance: Normal appearance. She is not ill-appearing.  HENT:     Head: Normocephalic and atraumatic.     Mouth/Throat:     Mouth: Mucous membranes are moist.  Eyes:     Extraocular Movements: Extraocular movements intact.  Cardiovascular:     Rate and Rhythm: Normal rate and regular rhythm.  Pulmonary:     Effort: Pulmonary effort is normal. No respiratory distress.     Breath sounds: Normal breath sounds. No wheezing.  Abdominal:     General: Bowel sounds are normal. There is no distension.     Palpations: Abdomen is soft.     Tenderness: There is no abdominal tenderness.  Musculoskeletal:        General: No swelling. Normal range of motion.     Cervical back: Normal range of motion and neck supple.  Skin:    General: Skin is warm and dry.  Neurological:     Mental Status: She is alert and oriented to person, place, and time.     Cranial Nerves: Cranial nerves 2-12 are intact. No cranial nerve deficit.     Sensory: Sensation is intact. No sensory deficit.     Motor: Weakness (4+/5 strength in RUE and RLE) present.     Coordination: Coordination is intact.  Psychiatric:        Mood and Affect: Mood and affect normal.        Speech: Speech normal.        Behavior: Behavior normal.        Thought Content: Thought content normal.        Cognition and Memory: Cognition normal.      Labs on Admission:  I have personally reviewed following labs and  imaging studies Results for orders placed or performed during the hospital encounter of 03/04/23 (from the past 24 hour(s))  POC CBG, ED     Status: Abnormal   Collection Time: 03/04/23  4:17 PM  Result Value Ref Range   Glucose-Capillary 107 (H) 70 - 99 mg/dL  CBC with Differential     Status: Abnormal   Collection Time: 03/04/23  4:18 PM  Result Value Ref Range   WBC 5.9 4.0 - 10.5 K/uL   RBC 4.97 3.87 - 5.11 MIL/uL   Hemoglobin 15.3 (H) 12.0 - 15.0 g/dL   HCT 45.9 36.0 - 46.0 %   MCV 92.4 80.0 - 100.0 fL   MCH 30.8 26.0 - 34.0 pg   MCHC 33.3 30.0 - 36.0 g/dL  RDW 12.6 11.5 - 15.5 %   Platelets 334 150 - 400 K/uL   nRBC 0.0 0.0 - 0.2 %   Neutrophils Relative % 46 %   Neutro Abs 2.7 1.7 - 7.7 K/uL   Lymphocytes Relative 43 %   Lymphs Abs 2.5 0.7 - 4.0 K/uL   Monocytes Relative 8 %   Monocytes Absolute 0.5 0.1 - 1.0 K/uL   Eosinophils Relative 2 %   Eosinophils Absolute 0.1 0.0 - 0.5 K/uL   Basophils Relative 1 %   Basophils Absolute 0.0 0.0 - 0.1 K/uL   Immature Granulocytes 0 %   Abs Immature Granulocytes 0.01 0.00 - 0.07 K/uL  Comprehensive metabolic panel     Status: None   Collection Time: 03/04/23  4:18 PM  Result Value Ref Range   Sodium 140 135 - 145 mmol/L   Potassium 3.8 3.5 - 5.1 mmol/L   Chloride 106 98 - 111 mmol/L   CO2 24 22 - 32 mmol/L   Glucose, Bld 95 70 - 99 mg/dL   BUN 16 8 - 23 mg/dL   Creatinine, Ser 0.72 0.44 - 1.00 mg/dL   Calcium 9.1 8.9 - 10.3 mg/dL   Total Protein 8.0 6.5 - 8.1 g/dL   Albumin 4.2 3.5 - 5.0 g/dL   AST 24 15 - 41 U/L   ALT 24 0 - 44 U/L   Alkaline Phosphatase 63 38 - 126 U/L   Total Bilirubin 0.6 0.3 - 1.2 mg/dL   GFR, Estimated >60 >60 mL/min   Anion gap 10 5 - 15  Magnesium     Status: None   Collection Time: 03/04/23  4:18 PM  Result Value Ref Range   Magnesium 2.3 1.7 - 2.4 mg/dL  Protime-INR     Status: None   Collection Time: 03/04/23  4:19 PM  Result Value Ref Range   Prothrombin Time 13.0 11.4 - 15.2 seconds    INR 1.0 0.8 - 1.2  APTT     Status: None   Collection Time: 03/04/23  4:19 PM  Result Value Ref Range   aPTT 30 24 - 36 seconds  Urine rapid drug screen (hosp performed)     Status: None   Collection Time: 03/04/23  6:01 PM  Result Value Ref Range   Opiates NONE DETECTED NONE DETECTED   Cocaine NONE DETECTED NONE DETECTED   Benzodiazepines NONE DETECTED NONE DETECTED   Amphetamines NONE DETECTED NONE DETECTED   Tetrahydrocannabinol NONE DETECTED NONE DETECTED   Barbiturates NONE DETECTED NONE DETECTED  Urinalysis, Routine w reflex microscopic -Urine, Clean Catch     Status: Abnormal   Collection Time: 03/04/23  6:01 PM  Result Value Ref Range   Color, Urine STRAW (A) YELLOW   APPearance CLEAR CLEAR   Specific Gravity, Urine 1.004 (L) 1.005 - 1.030   pH 6.0 5.0 - 8.0   Glucose, UA NEGATIVE NEGATIVE mg/dL   Hgb urine dipstick NEGATIVE NEGATIVE   Bilirubin Urine NEGATIVE NEGATIVE   Ketones, ur NEGATIVE NEGATIVE mg/dL   Protein, ur NEGATIVE NEGATIVE mg/dL   Nitrite NEGATIVE NEGATIVE   Leukocytes,Ua MODERATE (A) NEGATIVE   RBC / HPF 0-5 0 - 5 RBC/hpf   WBC, UA 21-50 0 - 5 WBC/hpf   Bacteria, UA MANY (A) NONE SEEN   Squamous Epithelial / HPF 0-5 0 - 5 /HPF     Radiological Exams on Admission: CT Head Wo Contrast  Result Date: 03/04/2023 CLINICAL DATA:  Dizziness and right-sided facial numbness EXAM: CT HEAD  WITHOUT CONTRAST TECHNIQUE: Contiguous axial images were obtained from the base of the skull through the vertex without intravenous contrast. RADIATION DOSE REDUCTION: This exam was performed according to the departmental dose-optimization program which includes automated exposure control, adjustment of the mA and/or kV according to patient size and/or use of iterative reconstruction technique. COMPARISON:  05/10/2009 FINDINGS: Brain: Hypodensity in the anterior limb of the left internal capsule is new from the prior exam. No evidence of acute cortical infarction, hemorrhage,  mass, mass effect, or midline shift. No hydrocephalus or extra-axial fluid collection. Periventricular white matter changes, likely the sequela of chronic small vessel ischemic disease. Vascular: No hyperdense vessel. Skull: Negative for fracture or focal lesion. Sinuses/Orbits: No acute finding. Other: The mastoid air cells are well aerated. IMPRESSION: 1. Hypodensity in the anterior limb of the left internal capsule is new from the prior exam, and could represent an age-indeterminate infarct. MRI could be considered for further evaluation. 2. No acute cortical infarction. These results were called by telephone at the time of interpretation on 03/04/2023 at 4:51 pm to provider HAYLEY NAASZ , who verbally acknowledged these results. Electronically Signed   By: Merilyn Baba M.D.   On: 03/04/2023 16:51   CT Head Wo Contrast  Final Result    CT ANGIO HEAD NECK W WO CM    (Results Pending)  MR BRAIN WO CONTRAST    (Results Pending)    Consults called:  Neurology   EKG: Independently reviewed. NSR   Dwyane Dee, MD [facility] 03/04/2023, 7:23 PM

## 2023-03-04 NOTE — ED Triage Notes (Signed)
Patient is here for evaluation of dizziness and right sided face numbness. Patient reports that this started last Sunday. Pt reports double vision as well. Pt ambulates well without assistance.

## 2023-03-04 NOTE — Hospital Course (Signed)
Brenda Ortega is a 67 yo Burmese speaking female with past medical history of hyperlipidemia not on medication as well as arthritis and lumbar spinal stenosis who presented to the emergency room on the evening of 3/26 with right-sided facial numbness and dizziness that had started 2 days prior.  Patient states that she had a similar episode a few months prior.  CT scan of the head noted hypodensity involving the anterior limb of the left internal capsule concerning for underlying age-indeterminate infarct.  Patient brought in for further evaluation.

## 2023-03-05 ENCOUNTER — Observation Stay (HOSPITAL_COMMUNITY): Payer: BC Managed Care – PPO

## 2023-03-05 ENCOUNTER — Observation Stay (HOSPITAL_BASED_OUTPATIENT_CLINIC_OR_DEPARTMENT_OTHER): Payer: BC Managed Care – PPO

## 2023-03-05 DIAGNOSIS — I5032 Chronic diastolic (congestive) heart failure: Secondary | ICD-10-CM

## 2023-03-05 DIAGNOSIS — E7800 Pure hypercholesterolemia, unspecified: Secondary | ICD-10-CM | POA: Diagnosis not present

## 2023-03-05 DIAGNOSIS — I10 Essential (primary) hypertension: Secondary | ICD-10-CM | POA: Diagnosis not present

## 2023-03-05 DIAGNOSIS — E663 Overweight: Secondary | ICD-10-CM | POA: Diagnosis not present

## 2023-03-05 DIAGNOSIS — R946 Abnormal results of thyroid function studies: Secondary | ICD-10-CM | POA: Diagnosis not present

## 2023-03-05 DIAGNOSIS — I110 Hypertensive heart disease with heart failure: Secondary | ICD-10-CM | POA: Diagnosis not present

## 2023-03-05 DIAGNOSIS — R7989 Other specified abnormal findings of blood chemistry: Secondary | ICD-10-CM

## 2023-03-05 DIAGNOSIS — Z79899 Other long term (current) drug therapy: Secondary | ICD-10-CM | POA: Diagnosis not present

## 2023-03-05 DIAGNOSIS — R42 Dizziness and giddiness: Secondary | ICD-10-CM

## 2023-03-05 DIAGNOSIS — N390 Urinary tract infection, site not specified: Secondary | ICD-10-CM

## 2023-03-05 DIAGNOSIS — E039 Hypothyroidism, unspecified: Secondary | ICD-10-CM | POA: Diagnosis not present

## 2023-03-05 DIAGNOSIS — Z7982 Long term (current) use of aspirin: Secondary | ICD-10-CM | POA: Diagnosis not present

## 2023-03-05 LAB — CBC WITH DIFFERENTIAL/PLATELET
Abs Immature Granulocytes: 0.02 10*3/uL (ref 0.00–0.07)
Basophils Absolute: 0 10*3/uL (ref 0.0–0.1)
Basophils Relative: 1 %
Eosinophils Absolute: 0.1 10*3/uL (ref 0.0–0.5)
Eosinophils Relative: 2 %
HCT: 42.5 % (ref 36.0–46.0)
Hemoglobin: 14.5 g/dL (ref 12.0–15.0)
Immature Granulocytes: 0 %
Lymphocytes Relative: 49 %
Lymphs Abs: 2.6 10*3/uL (ref 0.7–4.0)
MCH: 31.4 pg (ref 26.0–34.0)
MCHC: 34.1 g/dL (ref 30.0–36.0)
MCV: 92 fL (ref 80.0–100.0)
Monocytes Absolute: 0.5 10*3/uL (ref 0.1–1.0)
Monocytes Relative: 9 %
Neutro Abs: 2 10*3/uL (ref 1.7–7.7)
Neutrophils Relative %: 39 %
Platelets: 304 10*3/uL (ref 150–400)
RBC: 4.62 MIL/uL (ref 3.87–5.11)
RDW: 12.6 % (ref 11.5–15.5)
WBC: 5.3 10*3/uL (ref 4.0–10.5)
nRBC: 0 % (ref 0.0–0.2)

## 2023-03-05 LAB — T4, FREE: Free T4: 0.89 ng/dL (ref 0.61–1.12)

## 2023-03-05 LAB — URINALYSIS, W/ REFLEX TO CULTURE (INFECTION SUSPECTED)
Bilirubin Urine: NEGATIVE
Glucose, UA: NEGATIVE mg/dL
Hgb urine dipstick: NEGATIVE
Ketones, ur: NEGATIVE mg/dL
Nitrite: POSITIVE — AB
Protein, ur: NEGATIVE mg/dL
Specific Gravity, Urine: 1.016 (ref 1.005–1.030)
WBC, UA: >50 WBC/hpf (ref 0–5)
pH: 6 (ref 5.0–8.0)

## 2023-03-05 LAB — BASIC METABOLIC PANEL
Anion gap: 9 (ref 5–15)
BUN: 14 mg/dL (ref 8–23)
CO2: 22 mmol/L (ref 22–32)
Calcium: 8.8 mg/dL — ABNORMAL LOW (ref 8.9–10.3)
Chloride: 107 mmol/L (ref 98–111)
Creatinine, Ser: 0.76 mg/dL (ref 0.44–1.00)
GFR, Estimated: >60 mL/min (ref >60)
Glucose, Bld: 106 mg/dL — ABNORMAL HIGH (ref 70–99)
Potassium: 3.4 mmol/L — ABNORMAL LOW (ref 3.5–5.1)
Sodium: 138 mmol/L (ref 135–145)

## 2023-03-05 LAB — ECHOCARDIOGRAM COMPLETE
Area-P 1/2: 3.83 cm2
Height: 62 in
S' Lateral: 2.6 cm
Weight: 2366.86 oz

## 2023-03-05 LAB — MAGNESIUM: Magnesium: 2.3 mg/dL (ref 1.7–2.4)

## 2023-03-05 LAB — LIPID PANEL
Cholesterol: 239 mg/dL — ABNORMAL HIGH (ref 0–200)
HDL: 34 mg/dL — ABNORMAL LOW (ref >40)
LDL Cholesterol: UNDETERMINED mg/dL (ref 0–99)
Total CHOL/HDL Ratio: 7 RATIO
Triglycerides: 515 mg/dL — ABNORMAL HIGH (ref <150)
VLDL: UNDETERMINED mg/dL (ref 0–40)

## 2023-03-05 LAB — LDL CHOLESTEROL, DIRECT: Direct LDL: 104 mg/dL — ABNORMAL HIGH (ref 0–99)

## 2023-03-05 LAB — TSH: TSH: 7.271 u[IU]/mL — ABNORMAL HIGH (ref 0.350–4.500)

## 2023-03-05 LAB — HIV ANTIBODY (ROUTINE TESTING W REFLEX): HIV Screen 4th Generation wRfx: NONREACTIVE

## 2023-03-05 MED ORDER — ORAL CARE MOUTH RINSE: 15.0000 mL | OROMUCOSAL | Status: DC | PRN

## 2023-03-05 MED ORDER — CEFPODOXIME PROXETIL 200 MG PO TABS: 200.0000 mg | ORAL_TABLET | Freq: Two times a day (BID) | ORAL | 0 refills | Status: AC

## 2023-03-05 MED ORDER — SENNOSIDES-DOCUSATE SODIUM 8.6-50 MG PO TABS: 1.0000 | ORAL_TABLET | Freq: Every evening | ORAL | Status: DC | PRN

## 2023-03-05 MED ORDER — ENOXAPARIN SODIUM 40 MG/0.4ML IJ SOSY
40.0000 mg | PREFILLED_SYRINGE | Freq: Every day | INTRAMUSCULAR | Status: DC
  Administered 2023-03-05: 40 mg via SUBCUTANEOUS
  Filled 2023-03-05: qty 0.4

## 2023-03-05 MED ORDER — ACETAMINOPHEN 160 MG/5ML PO SOLN: 650.0000 mg | ORAL | Status: DC | PRN

## 2023-03-05 MED ORDER — ACETAMINOPHEN 325 MG PO TABS: 650.0000 mg | ORAL_TABLET | ORAL | Status: DC | PRN

## 2023-03-05 MED ORDER — FENOFIBRATE 145 MG PO TABS: 145.0000 mg | ORAL_TABLET | Freq: Every day | ORAL | 11 refills | Status: DC

## 2023-03-05 MED ORDER — LOSARTAN POTASSIUM-HCTZ 50-12.5 MG PO TABS: 1.0000 | ORAL_TABLET | Freq: Every day | ORAL | 11 refills | Status: DC

## 2023-03-05 MED ORDER — SODIUM CHLORIDE 0.9 % IV SOLN
1.0000 g | INTRAVENOUS | Status: DC
  Administered 2023-03-05: 1 g via INTRAVENOUS
  Filled 2023-03-05: qty 10

## 2023-03-05 MED ORDER — STROKE: EARLY STAGES OF RECOVERY BOOK
Freq: Once | Status: AC
  Filled 2023-03-05: qty 1

## 2023-03-05 MED ORDER — ACETAMINOPHEN 650 MG RE SUPP: 650.0000 mg | RECTAL | Status: DC | PRN

## 2023-03-05 NOTE — TOC Transition Note (Signed)
Transition of Care [facility]) - CM/SW Discharge Note   Patient Details  Name: Brenda Ortega MRN: [id number] Date of Birth: 05-02-1956  Transition of [facility]) CM/SW Contact:  Pollie Friar, RN Phone Number: 03/05/2023, 1:26 PM   Clinical Narrative:    Pt is discharging home with self care. No f/u per PT/OT and no DME needs.  Pt states she lives with her spouse and he is with her most of the time.  Spouse provides needed transportation. Pt manages her own medications and denies any issues. Spouse will transport home when discharged.  Final next level of care: Home/Self Care Barriers to Discharge: No Barriers Identified   Patient Goals and CMS Choice      Discharge Placement                         Discharge Plan and Services Additional resources added to the After Visit Summary for                                       Social Determinants of Health (SDOH) Interventions SDOH Screenings   Tobacco Use: Low Risk  (03/04/2023)     Readmission Risk Interventions     No data to display

## 2023-03-05 NOTE — Progress Notes (Signed)
Pt is back to room from MRI.  ?

## 2023-03-05 NOTE — Progress Notes (Signed)
Patient back to unit After ECHO

## 2023-03-05 NOTE — Evaluation (Signed)
Physical Therapy Evaluation & Discharge  Patient Details Name: Brenda Ortega MRN: [id number] DOB: [birth year] Today's Date: 03/05/2023  History of Present Illness  Pt is a 67 y.o. female who presented 03/04/23 with R facial numbness, double vision, and persistent dizziness. MRI negative for acute infarct. CT angio of head and neck negative for emergent large vessel occlusion or hemodynamically significant stenosis of the head or neck. PMH: HLD, osteopenia, GERD, arthritis, lumbar spinal stenosis, DM, HTN, sleep apnea   Clinical Impression  Pt presents with condition above. PTA, she was living with her husband in a 1-level house with 3 STE, independent, and working as a Secretary/administrator. Currently, pt is performing functional mobility at a mod I-independent level without DME, assistance, or LOB. She displays intact, WFL, and symmetrical bil lower extremity strength, sensation, and coordination. Her dizziness is described more as a headache and pt denied a spinning sensation. Her dizziness does not appear to be related to BPPV/vestibular system, see below. All education completed and questions answered. PT will sign off.    Vestibular Assessment - 03/05/23 0001       Vestibular Assessment   General Observation Pt does not display any nystagmus and declines any worsening of her symptoms with all vestibular tests performed this date. In addition, she described the dizziness more as a headache and denied it feeling like the room was spinning. It does not appear to be vestibular/BPPV related.      Symptom Behavior   Subjective history of current problem Pt reports sudden onset of a "headache" while working, but it appears to have gradually improved. Pt denied any provoking movements or spinning sensations.    Type of Dizziness  "Funny feeling in head";Comment   "headache"   Frequency of Dizziness spontaneous    Symptom Nature Spontaneous    Aggravating Factors Not applicable    Progression of Symptoms Better     History of similar episodes No      Oculomotor Exam   Oculomotor Alignment Normal    Ocular ROM WFL    Spontaneous Absent    Gaze-induced  Absent    Head shaking Horizontal Absent    Head Shaking Vertical Absent    Smooth Pursuits Intact      Oculomotor Exam-Fixation Suppressed    Left Head Impulse negative    Right Head Impulse negative      Positional Testing   Dix-Hallpike Dix-Hallpike Right;Dix-Hallpike Left    Horizontal Canal Testing Horizontal Canal Right;Horizontal Canal Left      Dix-Hallpike Right   Dix-Hallpike Right Duration negative    Dix-Hallpike Right Symptoms No nystagmus      Dix-Hallpike Left   Dix-Hallpike Left Duration negative    Dix-Hallpike Left Symptoms No nystagmus      Horizontal Canal Right   Horizontal Canal Right Duration negative    Horizontal Canal Right Symptoms Normal      Horizontal Canal Left   Horizontal Canal Left Duration negative    Horizontal Canal Left Symptoms Normal                   Recommendations for follow up therapy are one component of a multi-disciplinary discharge planning process, led by the attending physician.  Recommendations may be updated based on patient status, additional functional criteria and insurance authorization.  Follow Up Recommendations       Assistance Recommended at Discharge None  Patient can return home with the following       Equipment Recommendations None recommended by PT  Recommendations for Other Services       Functional Status Assessment Patient has not had a recent decline in their functional status     Precautions / Restrictions Precautions Precautions: None Restrictions Weight Bearing Restrictions: No      Mobility  Bed Mobility Overal bed mobility: Modified Independent             General bed mobility comments: HOB elevated, no assistance needed    Transfers Overall transfer level: Independent Equipment used: None               General  transfer comment: No assistance needed, no LOB    Ambulation/Gait Ambulation/Gait assistance: Modified independent (Device/Increase time) Gait Distance (Feet): 130 Feet Assistive device: None Gait Pattern/deviations: WFL(Within Functional Limits) Gait velocity: WFL Gait velocity interpretation: >2.62 ft/sec, indicative of community ambulatory   General Gait Details: Pt with slightly slowed speed, but reports she feels at her baseline with mobility. No LOB, even when cued to change directions and head position.  Stairs            Wheelchair Mobility    Modified Rankin (Stroke Patients Only) Modified Rankin (Stroke Patients Only) Pre-Morbid Rankin Score: No symptoms Modified Rankin: No significant disability     Balance Overall balance assessment: No apparent balance deficits (not formally assessed)                                           Pertinent Vitals/Pain Pain Assessment Pain Assessment: Faces Faces Pain Scale: Hurts a little bit Pain Location: headache Pain Descriptors / Indicators: Headache Pain Intervention(s): Limited activity within patient's tolerance, Monitored during session    Home Living Family/patient expects to be discharged to:: Private residence Living Arrangements: Spouse/significant other Available Help at Discharge: Family;Available 24 hours/day Type of Home: House Home Access: Stairs to enter Entrance Stairs-Rails: Can reach both Entrance Stairs-Number of Steps: 3   Home Layout: One level Home Equipment: None      Prior Function Prior Level of Function : Independent/Modified Independent             Mobility Comments: No AD. No falls. ADLs Comments: Completely independent. Drives. Works as a Secretary/administrator.     Hand Dominance        Extremity/Trunk Assessment   Upper Extremity Assessment Upper Extremity Assessment: Defer to OT evaluation    Lower Extremity Assessment Lower Extremity Assessment: Overall  WFL for tasks assessed (MMT scores of 4+ to 5 grossly bil, symmetrical; sensation and coordination intact bil)    Cervical / Trunk Assessment Cervical / Trunk Assessment: Normal  Communication   Communication: Prefers language other than English (Burmese -  used video interpreter Evelena Peat [phone])  Cognition Arousal/Alertness: Awake/alert Behavior During Therapy: WFL for tasks assessed/performed Overall Cognitive Status: Within Functional Limits for tasks assessed                                 General Comments: May benefit from further assessment due to language barrier, but able to recount events leading up to hospitalization        General Comments      Exercises     Assessment/Plan    PT Assessment Patient does not need any further PT services  PT Problem List         PT Treatment Interventions  PT Goals (Current goals can be found in the Care Plan section)  Acute Rehab PT Goals Patient Stated Goal: to get better PT Goal Formulation: All assessment and education complete, DC therapy Time For Goal Achievement: 03/06/23 Potential to Achieve Goals: Good    Frequency       Co-evaluation               AM-PAC PT "6 Clicks" Mobility  Outcome Measure Help needed turning from your back to your side while in a flat bed without using bedrails?: None Help needed moving from lying on your back to sitting on the side of a flat bed without using bedrails?: None Help needed moving to and from a bed to a chair (including a wheelchair)?: None Help needed standing up from a chair using your arms (e.g., wheelchair or bedside chair)?: None Help needed to walk in hospital room?: None Help needed climbing 3-5 steps with a railing? : None 6 Click Score: 24    End of Session Equipment Utilized During Treatment: Gait belt Activity Tolerance: Patient tolerated treatment well Patient left: in bed;with call bell/phone within reach;with bed alarm set;with  family/visitor present Nurse Communication: Mobility status PT Visit Diagnosis: Dizziness and giddiness (R42)    Time: [id number] PT Time Calculation (min) (ACUTE ONLY): 29 min   Charges:   PT Evaluation $PT Eval Low Complexity: 1 Low PT Treatments $Therapeutic Activity: 8-22 mins        Moishe Spice, PT, DPT Acute Rehabilitation Services  Office: [place] 03/05/2023, 8:58 AM

## 2023-03-05 NOTE — Plan of Care (Signed)
  Problem: Education: Goal: Knowledge of disease or condition will improve Outcome: Progressing   Problem: Coping: Goal: Will verbalize positive feelings about self Outcome: Progressing Goal: Will identify appropriate support needs Outcome: Progressing   Problem: Self-Care: Goal: Ability to participate in self-care as condition permits will improve Outcome: Progressing Goal: Ability to communicate needs accurately will improve Outcome: Progressing   Problem: Nutrition: Goal: Risk of aspiration will decrease Outcome: Progressing Goal: Dietary intake will improve Outcome: Progressing   

## 2023-03-05 NOTE — Evaluation (Addendum)
Occupational Therapy Evaluation Patient Details Name: Brenda Ortega MRN: [id number] DOB: 16-Dec-1955 Today's Date: 03/05/2023   History of Present Illness Pt is a 67 y.o. female who presented 03/04/23 with R facial numbness, double vision, and persistent dizziness. MRI negative for acute infarct. CT angio of head and neck negative for emergent large vessel occlusion or hemodynamically significant stenosis of the head or neck. PMH: HLD, osteopenia, GERD, arthritis, lumbar spinal stenosis, DM, HTN, sleep apnea   Clinical Impression   Pt admitted for above and presents with problem list below.  PTA pt independent, driving and working as a Secretary/administrator. She demonstrates ability to complete ADLs, functional mobility and transfers with independence.  Reports mild numbness in R hand, but functionally WFL.  Also reports some diplopia with reading distance- difficult to discern due to language barrier but appears to be improving, may be more blurry vision.  Will follow acutely to check on vision, but educated pt with recommendations to follow up with her eye doctor and to avoid driving with any visual changes- she is agreeable.  Pt with no further questions today. Anticipate no further OT needs after dc home.      Recommendations for follow up therapy are one component of a multi-disciplinary discharge planning process, led by the attending physician.  Recommendations may be updated based on patient status, additional functional criteria and insurance authorization.   Assistance Recommended at Discharge Intermittent Supervision/Assistance  Patient can return home with the following Assistance with cooking/housework;Assist for transportation    Functional Status Assessment     Equipment Recommendations  None recommended by OT    Recommendations for Other Services       Precautions / Restrictions Precautions Precautions: None Restrictions Weight Bearing Restrictions: No      Mobility Bed  Mobility Overal bed mobility: Independent                  Transfers Overall transfer level: Independent                        Balance Overall balance assessment: No apparent balance deficits (not formally assessed)                                         ADL either performed or assessed with clinical judgement   ADL Overall ADL's : Independent                                       General ADL Comments: no assist required     Vision Baseline Vision/History: 1 Wears glasses (reading) Ability to See in Adequate Light: 0 Adequate Patient Visual Report:  (pt reports some blurring of vision, diplopia when trying to read. She does report improved since admission though.) Vision Assessment?: Yes Eye Alignment: Within Functional Limits Ocular Range of Motion: Within Functional Limits Alignment/Gaze Preference: Within Defined Limits Tracking/Visual Pursuits: Able to track stimulus in all quads without difficulty Saccades: Within functional limits Convergence: Within functional limits Visual Fields: No apparent deficits Diplopia Assessment: Disappears with one eye closed;Present in near gaze;Objects split side to side Additional Comments: pt continues to report diplopia with reading distance, she reports blurry vision first and then side by side dipolia. Question if this is eye fatigue with decreased attention, or true diplopia.  Difficult to  discern due to language barrier and translation required.  Depth perception appears WFL.  Will continue to assess.     Perception     Praxis      Pertinent Vitals/Pain Pain Assessment Pain Assessment: No/denies pain     Hand Dominance Right   Extremity/Trunk Assessment Upper Extremity Assessment Upper Extremity Assessment: Overall WFL for tasks assessed (reports mild numbness in R hand, but when tested appears [facility])   Lower Extremity Assessment Lower Extremity Assessment: Defer to PT  evaluation   Cervical / Trunk Assessment Cervical / Trunk Assessment: Normal   Communication Communication Communication: Prefers language other than English Brenda Ortega [id number])   Cognition Arousal/Alertness: Awake/alert Behavior During Therapy: WFL for tasks assessed/performed Overall Cognitive Status: Difficult to assess                                 General Comments: appears WFL, pt able to follow commands and engage appropriately     General Comments  utilized translator, discussed recommendations to follow up with her eye doctor and to not drive if she is still experiencing symptoms of diplopia/any visual changes    Exercises     Shoulder Instructions      Home Living Family/patient expects to be discharged to:: Private residence Living Arrangements: Spouse/significant other Available Help at Discharge: Family;Available 24 hours/day Type of Home: House Home Access: Stairs to enter [employer] of Steps: 3 Entrance Stairs-Rails: Can reach both Home Layout: One level     Bathroom Shower/Tub: Teacher, early years/pre: Standard     Home Equipment: None          Prior Functioning/Environment Prior Level of Function : Independent/Modified Independent             Mobility Comments: No AD. No falls. ADLs Comments: Completely independent. Drives. Works as a Secretary/administrator.        OT Problem List: Decreased activity tolerance;Impaired vision/perception      OT Treatment/Interventions: Cognitive remediation/compensation;Visual/perceptual remediation/compensation;Patient/family education    OT Goals(Current goals can be found in the care plan section) Acute Rehab OT Goals Patient Stated Goal: get better OT Goal Formulation: With patient Time For Goal Achievement: 03/19/23 Potential to Achieve Goals: Good  OT Frequency: Min 2X/week    Co-evaluation              AM-PAC OT "6 Clicks" Daily Activity     Outcome Measure  Help from another person eating meals?: None Help from another person taking care of personal grooming?: None Help from another person toileting, which includes using toliet, bedpan, or urinal?: None Help from another person bathing (including washing, rinsing, drying)?: None Help from another person to put on and taking off regular upper body clothing?: None Help from another person to put on and taking off regular lower body clothing?: None 6 Click Score: 24   End of Session Nurse Communication: Mobility status  Activity Tolerance: Patient tolerated treatment well Patient left: in bed;with call bell/phone within reach;with bed alarm set;with family/visitor present  OT Visit Diagnosis: Other abnormalities of gait and mobility (R26.89);Other symptoms and signs involving the nervous system (R29.898)                Time: [id number] OT Time Calculation (min): 27 min Charges:  OT General Charges $OT Visit: 1 Visit OT Evaluation $OT Eval Low Complexity: 1 Low OT Treatments $Self Care/Home Management : 8-22 mins  Jolaine Artist, OT  Acute Rehabilitation Services Office [phone]   Delight Stare 03/05/2023, 11:15 AM

## 2023-03-05 NOTE — Progress Notes (Signed)
  Echocardiogram 2D Echocardiogram has been performed.  Brenda Ortega 03/05/2023, 2:29 PM

## 2023-03-05 NOTE — Progress Notes (Signed)
Mobility Specialist: Progress Note   03/05/23 1550  Mobility  Activity Ambulated with assistance in hallway  Level of Assistance Standby assist, set-up cues, supervision of patient - no hands on  Assistive Device Other (Comment) (IV pole)  Distance Ambulated (ft) 400 ft  Activity Response Tolerated well  Mobility Referral Yes  $Mobility charge 1 Mobility   Received pt in bed having no complaints and agreeable to mobility. Pt was asymptomatic throughout ambulation and returned to room w/o fault. Left in bed w/ call bell in reach and all needs met.  [place] Aayra Hornbaker Mobility Specialist Please contact via SecureChat or Rehab office at [phone]

## 2023-03-05 NOTE — Plan of Care (Signed)
  Problem: Education: Goal: Knowledge of disease or condition will improve Outcome: Progressing   Problem: Ischemic Stroke/TIA Tissue Perfusion: Goal: Complications of ischemic stroke/TIA will be minimized Outcome: Progressing   Problem: Coping: Goal: Will verbalize positive feelings about self Outcome: Progressing Goal: Will identify appropriate support needs Outcome: Progressing   Problem: Education: Goal: Knowledge of General Education information will improve Description: Including pain rating scale, medication(s)/side effects and non-pharmacologic comfort measures Outcome: Progressing

## 2023-03-05 NOTE — Progress Notes (Signed)
Pt arrived to the unit from [facility] ED. Alert and oriented. Denies pain. Husband at bedside. Cardiac tele connected to CCMD. Call button within pt reach. Bed alarms on.

## 2023-03-05 NOTE — Consult Note (Signed)
Neurology Consultation  Reason for Consult: Dizziness Referring Physician: Girguis  CC: Dizziness  History is obtained from: Patient  HPI: Brenda Ortega is a 67 y.o. female with a past medical history of HLD, osteopenia, GERD, arthritis, lumbar spinal stenosis who presented to the ER with complaints of right facial numbness and persistent dizziness starting around 10am. She denies falls, but endorses 2 months of feeling increased fatigue with is abnormal. She initially presented to an urgent care but was then sent to the ED due to concern for a stroke. Interpreter was used throughout our exam.   LKW: Sunday tpa given?: No outside of window Premorbid modified Rankin scale (mRS): 0  ROS: Full ROS was performed and is negative except as noted in the HPI.   History reviewed. No pertinent past medical history.   History reviewed. No pertinent family history.  Social History:   reports that she has never smoked. She has never used smokeless tobacco. She reports that she does not drink alcohol and does not use drugs.  Medications  Current Facility-Administered Medications:     stroke: early stages of recovery book, , Does not apply, Once, Dwyane Dee, MD   0.9 %  sodium chloride infusion, , Intravenous, Continuous, Dwyane Dee, MD, Last Rate: 75 mL/hr at 03/05/23 0227, Restarted at 03/05/23 0227   acetaminophen (TYLENOL) tablet 650 mg, 650 mg, Oral, Q4H PRN **OR** acetaminophen (TYLENOL) 160 MG/5ML solution 650 mg, 650 mg, Per Tube, Q4H PRN **OR** acetaminophen (TYLENOL) suppository 650 mg, 650 mg, Rectal, Q4H PRN, Dwyane Dee, MD   aspirin EC tablet 81 mg, 81 mg, Oral, Daily, Dwyane Dee, MD   clopidogrel (PLAVIX) tablet 75 mg, 75 mg, Oral, Daily, Dwyane Dee, MD, 75 mg at 03/04/23 1920   enoxaparin (LOVENOX) injection 40 mg, 40 mg, Subcutaneous, Daily, Dwyane Dee, MD   senna-docusate (Senokot-S) tablet 1 tablet, 1 tablet, Oral, QHS PRN, Dwyane Dee, MD   Exam: Current  vital signs: BP 133/81 (BP Location: Left Arm)   Pulse 67   Temp 97.9 F (36.6 C) (Oral)   Resp 18   Ht 5\' 2"  (1.575 m)   Wt 67.1 kg   SpO2 96%   BMI 27.06 kg/m  Vital signs in last 24 hours: Temp:  [97.7 F (36.5 C)-98.1 F (36.7 C)] 97.9 F (36.6 C) (03/27 0749) Pulse Rate:  [59-83] 67 (03/27 0749) Resp:  [16-24] 18 (03/27 0749) BP: (124-147)/(69-103) 133/81 (03/27 0749) SpO2:  [94 %-99 %] 96 % (03/27 0749) Weight:  [67.1 kg] 67.1 kg (03/26 1552)  GENERAL: Awake, alert in NAD HEENT: - Normocephalic and atraumatic, dry mm, no LN++, no Thyromegally LUNGS - Clear to auscultation bilaterally with no wheezes CV - S1S2 RRR, no m/r/g, equal pulses bilaterally. ABDOMEN - Soft, nontender, nondistended with normoactive BS Ext: warm, well perfused, intact peripheral pulses, no edema  NEURO:  Mental Status: AA&Ox3  Language: speech is clear.  Naming, repetition, fluency, and comprehension intact. Cranial Nerves: PERRL mm/brisk. EOMI, visual fields full, no facial asymmetry, facial sensation intact, hearing intact, tongue/uvula/soft palate midline, normal sternocleidomastoid and trapezius muscle strength. No evidence of tongue atrophy or fasciculations Motor: Moves all extremities antigravity Tone: is normal and bulk is normal Sensation- Intact to light touch bilaterally Coordination: FTN intact bilaterally, no ataxia in BLE. Gait- deferred NIH 0   Labs I have reviewed labs in epic and the results pertinent to this consultation are:  CBC    Component Value Date/Time   WBC 5.3 03/05/2023 0409   RBC  4.62 03/05/2023 0409   HGB 14.5 03/05/2023 0409   HCT 42.5 03/05/2023 0409   PLT 304 03/05/2023 0409   MCV 92.0 03/05/2023 0409   MCH 31.4 03/05/2023 0409   MCHC 34.1 03/05/2023 0409   RDW 12.6 03/05/2023 0409   LYMPHSABS 2.6 03/05/2023 0409   MONOABS 0.5 03/05/2023 0409   EOSABS 0.1 03/05/2023 0409   BASOSABS 0.0 03/05/2023 0409    CMP     Component Value Date/Time    NA 138 03/05/2023 0409   K 3.4 (L) 03/05/2023 0409   CL 107 03/05/2023 0409   CO2 22 03/05/2023 0409   GLUCOSE 106 (H) 03/05/2023 0409   BUN 14 03/05/2023 0409   CREATININE 0.76 03/05/2023 0409   CALCIUM 8.8 (L) 03/05/2023 0409   PROT 8.0 03/04/2023 1618   ALBUMIN 4.2 03/04/2023 1618   AST 24 03/04/2023 1618   ALT 24 03/04/2023 1618   ALKPHOS 63 03/04/2023 1618   BILITOT 0.6 03/04/2023 1618   GFRNONAA >60 03/05/2023 0409   GFRAA  05/10/2009 1223    >60        The eGFR has been calculated using the MDRD equation. This calculation has not been validated in all clinical situations. eGFR's persistently <60 mL/min signify possible Chronic Kidney Disease.    Lipid Panel     Component Value Date/Time   CHOL 239 (H) 03/05/2023 0413   TRIG 515 (H) 03/05/2023 0413   HDL 34 (L) 03/05/2023 0413   CHOLHDL 7.0 03/05/2023 0413   VLDL UNABLE TO CALCULATE IF TRIGLYCERIDE OVER 400 mg/dL 03/05/2023 0413   LDLCALC UNABLE TO CALCULATE IF TRIGLYCERIDE OVER 400 mg/dL 03/05/2023 0413   LDLDIRECT 104 (H) 03/05/2023 0413     Imaging I have reviewed the images obtained:  CT-head No acute abnormality  CTA Head and Neck 1. No emergent large vessel occlusion or hemodynamically significant stenosis of the head or neck. 2. Aortic atherosclerosis (ICD10-I70.0).  MRI examination of the brain No acute infarct  Assessment:  67 y.o. female presenting with right facial numbness and persistent dizziness starting around 10am. She denies falls, but endorses 2 months of feeling increased fatigue with is abnormal. She initially presented to an urgent care but was then sent to the ED due to concern for a stroke. Interpreter was used throughout our exam. She denies dizziness or numbness at this time. She does report a slight headache. Of note, LDL 104, TSH 7.271, UA positive for bacteria and moderate leukocytes. Echocardiogram is pending. Given she does not have an acute infarct and her symptoms can be  explained by both her hypothyroidism and UTI, neurology will remain available as needed. Please call with any questions.   Impression: UTI, hypothyroidism   Recommendations: - Risk factor modification with statin given elevated LDL - Recommend thyroid management per primary team recommendations - Follow up with PCP outpatient for continued risk factor management of Ha1c and LDL  Neurology will remain available as needed. Please call with any questions.   Patient seen and examined by NP/APP with MD. MD to update note as needed.   Brenda Ores, DNP, FNP-BC [facility] Pager: [phone]

## 2023-03-05 NOTE — Progress Notes (Signed)
Discharge instructions given to the patient. °

## 2023-03-05 NOTE — Discharge Summary (Signed)
Physician Discharge Summary   Patient: Brenda Ortega MRN: [id number] DOB: [birth year]  Admit date:     03/04/2023  Discharge date: {dischdate:26783}  Discharge Physician: Annita Brod   PCP: Velna Hatchet, MD   Recommendations at discharge:  {Tip this will not be part of the note when signed- Example include specific recommendations for outpatient follow-up, pending tests to follow-up on. (Optional):26781}  ***  Discharge Diagnoses: Principal Problem:   Stroke [facility]) Active Problems:   Spinal stenosis at L4-L5 level  Resolved Problems:   * No resolved hospital problems. [facility] Course: Ms. Moros is a 67 yo Burmese speaking female with PMH HLD, osteopenia, GERD, arthritis, lumbar spinal stenosis who presented to the ER with complaints of right facial numbness and persistent dizziness.  She denies any falling from her dizziness but states symptoms began around 10 AM on Sunday morning and have mildly improved but remain present.  She finally presented to the ER for further evaluation due to this.  She denied any symptoms of similar in the past although endorses feeling sleepy/tired approximately 2 months ago which was abnormal for her.  She initially presented to urgent care and was referred to the ER due to her symptoms.  CT head was performed in the ER which showed hypodensity involving the anterior limb of the left IC concerning for underlying age-indeterminate infarct. She is admitted for further stroke workup.  Assessment and Plan: * Stroke (HCC) - High suspicion for acute/subacute CVA given symptoms of dizziness, right facial paresthesia, and right-sided mild weakness -CT head notes hypodensity involving anterior limb of left internal capsule, age-indeterminate -Discussed with neurology, patient to transfer to [facility] for further stroke neurology evaluation -Follow-up CTA head/neck and MRI brain - continue on tele - Echo ordered - Follow-up lipid, TSH, A1c -Start aspirin/Plavix and  follow-up further neurology recommendations  Spinal stenosis at L4-L5 level - currently asymptomatic; supportive care - resume home meds if needed; no chronic opioids noted on database      {Tip this will not be part of the note when signed Body mass index is 27.06 kg/m. , ,  (Optional):26781}  {(NOTE) Pain control PDMP Statment (Optional):26782} Consultants: *** Procedures performed: ***  Disposition: {Plan; Disposition:26390} Diet recommendation:  Discharge Diet Orders (From admission, onward)     Start     Ordered   03/05/23 0000  Diet - low sodium heart healthy        03 /27/24 1749           {Diet_Plan:26776} DISCHARGE MEDICATION: Allergies as of 03/05/2023       Reactions   Penicillins Itching, Rash        Medication List     TAKE these medications    ascorbic acid 500 MG tablet Commonly known as: VITAMIN C Take 500 mg by mouth daily.   cefpodoxime 200 MG tablet Commonly known as: VANTIN Take 1 tablet (200 mg total) by mouth 2 (two) times daily for 3 days.   fenofibrate 145 MG tablet Commonly known as: Tricor Take 1 tablet (145 mg total) by mouth daily.   losartan-hydrochlorothiazide 50-12.5 MG tablet Commonly known as: Hyzaar Take 1 tablet by mouth daily.   MAGNESIUM PO Take 1 tablet by mouth daily.   pantoprazole 20 MG tablet Commonly known as: PROTONIX Take 20 mg by mouth in the morning.   VITAMIN D-3 PO Take 1 tablet by mouth daily.   ZINC PO Take 1 tablet by mouth daily.  Discharge Exam: Filed Weights   03/04/23 1552  Weight: 67.1 kg   ***  Condition at discharge: {DC Condition:26389}  The results of significant diagnostics from this hospitalization (including imaging, microbiology, ancillary and laboratory) are listed below for reference.   Imaging Studies: ECHOCARDIOGRAM COMPLETE  Result Date: 03/05/2023    ECHOCARDIOGRAM REPORT   Patient Name:   BEILY BUSCEMI     Date of Exam: 03/05/2023 Medical Rec #:  [id number]   Height:       62.0 in Accession #:    [id number] Weight:       147.9 lb Date of Birth:  September 04, 1956 BSA:          1.682 m Patient Age:    67 years   BP:           133/81 mmHg Patient Gender: F          HR:           81 bpm. Exam Location:  Inpatient Procedure: 2D Echo, Color Doppler and Cardiac Doppler Indications:    stroke  History:        Patient has no prior history of Echocardiogram examinations.  Sonographer:    Johny Chess RDCS Referring Phys: [place]  1. Left ventricular ejection fraction, by estimation, is 60 to 65%. The left ventricle has normal function. The left ventricle has no regional wall motion abnormalities. Left ventricular diastolic parameters are consistent with Grade I diastolic dysfunction (impaired relaxation).  2. Right ventricular systolic function is normal. The right ventricular size is normal. Tricuspid regurgitation signal is inadequate for assessing PA pressure.  3. The mitral valve is normal in structure. No evidence of mitral valve regurgitation. No evidence of mitral stenosis.  4. The aortic valve is normal in structure. Aortic valve regurgitation is not visualized. No aortic stenosis is present.  5. The inferior vena cava is normal in size with greater than 50% respiratory variability, suggesting right atrial pressure of 3 mmHg. FINDINGS  Left Ventricle: Left ventricular ejection fraction, by estimation, is 60 to 65%. The left ventricle has normal function. The left ventricle has no regional wall motion abnormalities. The left ventricular internal cavity size was normal in size. There is  no left ventricular hypertrophy. Left ventricular diastolic parameters are consistent with Grade I diastolic dysfunction (impaired relaxation). Right Ventricle: The right ventricular size is normal. No increase in right ventricular wall thickness. Right ventricular systolic function is normal. Tricuspid regurgitation signal is inadequate for assessing PA pressure. Left  Atrium: Left atrial size was normal in size. Right Atrium: Right atrial size was normal in size. Pericardium: There is no evidence of pericardial effusion. Mitral Valve: The mitral valve is normal in structure. No evidence of mitral valve regurgitation. No evidence of mitral valve stenosis. Tricuspid Valve: The tricuspid valve is normal in structure. Tricuspid valve regurgitation is not demonstrated. No evidence of tricuspid stenosis. Aortic Valve: The aortic valve is normal in structure. Aortic valve regurgitation is not visualized. No aortic stenosis is present. Pulmonic Valve: The pulmonic valve was normal in structure. Pulmonic valve regurgitation is not visualized. No evidence of pulmonic stenosis. Aorta: The aortic root is normal in size and structure. Venous: The inferior vena cava is normal in size with greater than 50% respiratory variability, suggesting right atrial pressure of 3 mmHg. IAS/Shunts: No atrial level shunt detected by color flow Doppler.  LEFT VENTRICLE PLAX 2D LVIDd:         4.20 cm   Diastology LVIDs:  2.60 cm   LV e' medial:    5.00 cm/s LV PW:         0.90 cm   LV E/e' medial:  10.4 LV IVS:        0.90 cm   LV e' lateral:   9.25 cm/s LVOT diam:     1.70 cm   LV E/e' lateral: 5.6 LV SV:         36 LV SV Index:   21 LVOT Area:     2.27 cm  RIGHT VENTRICLE             IVC RV S prime:     12.30 cm/s  IVC diam: 1.40 cm TAPSE (M-mode): 1.9 cm LEFT ATRIUM             Index        RIGHT ATRIUM          Index LA diam:        3.80 cm 2.26 cm/m   RA Area:     9.25 cm LA Vol (A2C):   34.4 ml 20.46 ml/m  RA Volume:   17.40 ml 10.35 ml/m LA Vol (A4C):   30.8 ml 18.31 ml/m LA Biplane Vol: 33.2 ml 19.74 ml/m  AORTIC VALVE LVOT Vmax:   89.70 cm/s LVOT Vmean:  58.500 cm/s LVOT VTI:    0.159 m  AORTA Ao Root diam: 2.60 cm Ao Asc diam:  3.20 cm MITRAL VALVE MV Area (PHT): 3.83 cm     SHUNTS MV Decel Time: 198 msec     Systemic VTI:  0.16 m MV E velocity: 52.00 cm/s   Systemic Diam: 1.70 cm MV A  velocity: 103.00 cm/s MV E/A ratio:  0.50 Kardie Tobb DO Electronically signed by Berniece Salines DO Signature Date/Time: 03/05/2023/2:31:10 PM    Final    MR BRAIN WO CONTRAST  Result Date: 03/05/2023 CLINICAL DATA:  Right facial numbness EXAM: MRI HEAD WITHOUT CONTRAST TECHNIQUE: Multiplanar, multiecho pulse sequences of the brain and surrounding structures were obtained without intravenous contrast. COMPARISON:  None Available. FINDINGS: Only diffusion-weighted imaging was performed. There is no acute infarct. No hydrocephalus or mass effect. IMPRESSION: No acute infarct. Electronically Signed   By: Ulyses Jarred M.D.   On: 03/05/2023 03:52   CT ANGIO HEAD NECK W WO CM  Result Date: 03/04/2023 CLINICAL DATA:  [place] EXAM: CT ANGIOGRAPHY HEAD AND NECK TECHNIQUE: Multidetector CT imaging of the head and neck was performed using the standard protocol during bolus administration of intravenous contrast. Multiplanar CT image reconstructions and MIPs were obtained to evaluate the vascular anatomy. Carotid stenosis measurements (when applicable) are obtained utilizing NASCET criteria, using the distal internal carotid diameter as the denominator. RADIATION DOSE REDUCTION: This exam was performed according to the departmental dose-optimization program which includes automated exposure control, adjustment of the mA and/or kV according to patient size and/or use of iterative reconstruction technique. CONTRAST:  72mL OMNIPAQUE IOHEXOL 350 MG/ML SOLN COMPARISON:  03/04/2023 head CT FINDINGS: CT HEAD FINDINGS Brain: There is no mass, hemorrhage or extra-axial collection. The size and configuration of the ventricles and extra-axial CSF spaces are normal. Unchanged hypodense focus at the anterior limb of the left internal capsule. There is hypoattenuation of the periventricular white matter, most commonly indicating chronic ischemic microangiopathy. Skull: The visualized skull base, calvarium and extracranial soft tissues  are normal. Sinuses/Orbits: No fluid levels or advanced mucosal thickening of the visualized paranasal sinuses. No mastoid or middle ear effusion. The orbits are  normal. CTA NECK FINDINGS SKELETON: There is no bony spinal canal stenosis. No lytic or blastic lesion. OTHER NECK: Normal pharynx, larynx and major salivary glands. No cervical lymphadenopathy. Unremarkable thyroid gland. UPPER CHEST: No pneumothorax or pleural effusion. No nodules or masses. AORTIC ARCH: There is calcific atherosclerosis of the aortic arch. There is no aneurysm, dissection or hemodynamically significant stenosis of the visualized portion of the aorta. Conventional 3 vessel aortic branching pattern. The visualized proximal subclavian arteries are widely patent. RIGHT CAROTID SYSTEM: Normal without aneurysm, dissection or stenosis. LEFT CAROTID SYSTEM: Normal without aneurysm, dissection or stenosis. VERTEBRAL ARTERIES: Left dominant configuration. Both origins are clearly patent. There is no dissection, occlusion or flow-limiting stenosis to the skull base (V1-V3 segments). CTA HEAD FINDINGS POSTERIOR CIRCULATION: --Vertebral arteries: Normal V4 segments. --Inferior cerebellar arteries: Normal. --Basilar artery: Normal. --Superior cerebellar arteries: Normal. --Posterior cerebral arteries (PCA): Normal. ANTERIOR CIRCULATION: --Intracranial internal carotid arteries: Normal. --Anterior cerebral arteries (ACA): Normal. Both A1 segments are present. Patent anterior communicating artery (a-comm). --Middle cerebral arteries (MCA): Normal. VENOUS SINUSES: As permitted by contrast timing, patent. ANATOMIC VARIANTS: None Review of the MIP images confirms the above findings. IMPRESSION: 1. No emergent large vessel occlusion or hemodynamically significant stenosis of the head or neck. 2. Aortic atherosclerosis (ICD10-I70.0). Electronically Signed   By: Ulyses Jarred M.D.   On: 03/04/2023 19:28   CT Head Wo Contrast  Result Date:  03/04/2023 CLINICAL DATA:  Dizziness and right-sided facial numbness EXAM: CT HEAD WITHOUT CONTRAST TECHNIQUE: Contiguous axial images were obtained from the base of the skull through the vertex without intravenous contrast. RADIATION DOSE REDUCTION: This exam was performed according to the departmental dose-optimization program which includes automated exposure control, adjustment of the mA and/or kV according to patient size and/or use of iterative reconstruction technique. COMPARISON:  05/10/2009 FINDINGS: Brain: Hypodensity in the anterior limb of the left internal capsule is new from the prior exam. No evidence of acute cortical infarction, hemorrhage, mass, mass effect, or midline shift. No hydrocephalus or extra-axial fluid collection. Periventricular white matter changes, likely the sequela of chronic small vessel ischemic disease. Vascular: No hyperdense vessel. Skull: Negative for fracture or focal lesion. Sinuses/Orbits: No acute finding. Other: The mastoid air cells are well aerated. IMPRESSION: 1. Hypodensity in the anterior limb of the left internal capsule is new from the prior exam, and could represent an age-indeterminate infarct. MRI could be considered for further evaluation. 2. No acute cortical infarction. These results were called by telephone at the time of interpretation on 03/04/2023 at 4:51 pm to provider HAYLEY NAASZ , who verbally acknowledged these results. Electronically Signed   By: Merilyn Baba M.D.   On: 03/04/2023 16:51    Microbiology: Results for orders placed or performed during the hospital encounter of 10/09/21  SARS CORONAVIRUS 2 (TAT 6-24 HRS) Nasopharyngeal Nasopharyngeal Swab     Status: None   Collection Time: 10/09/21  2:57 PM   Specimen: Nasopharyngeal Swab  Result Value Ref Range Status   SARS Coronavirus 2 NEGATIVE NEGATIVE Final    Comment: (NOTE) SARS-CoV-2 target nucleic acids are NOT DETECTED.  The SARS-CoV-2 RNA is generally detectable in upper and  lower respiratory specimens during the acute phase of infection. Negative results do not preclude SARS-CoV-2 infection, do not rule out co-infections with other pathogens, and should not be used as the sole basis for treatment or other patient management decisions. Negative results must be combined with clinical observations, patient history, and epidemiological information. The expected result is Negative.  Fact Sheet for Patients: SugarRoll.be  Fact Sheet for Healthcare Providers: https://www.woods-mathews.com/  This test is not yet approved or cleared by the [place] FDA and  has been authorized for detection and/or diagnosis of SARS-CoV-2 by FDA under an Emergency Use Authorization (EUA). This EUA will remain  in effect (meaning this test can be used) for the duration of the COVID-19 declaration under Se ction 564(b)(1) of the Act, 21 U.S.C. section 360bbb-3(b)(1), unless the authorization is terminated or revoked sooner.  Performed at [facility], [place] [address], [place], [place] [address]   Surgical pcr screen     Status: None   Collection Time: 10/09/21  2:58 PM   Specimen: Nasal Mucosa; Nasal Swab  Result Value Ref Range Status   MRSA, PCR NEGATIVE NEGATIVE Final   Staphylococcus aureus NEGATIVE NEGATIVE Final    Comment: (NOTE) The Xpert SA Assay (FDA approved for NASAL specimens in patients 33 years of age and older), is one component of a comprehensive surveillance program. It is not intended to diagnose infection nor to guide or monitor treatment. Performed at [facility], [place] [address] [place], [place] [address]     Labs: CBC: Recent Labs  Lab 03/04/23 1618 03/05/23 0409  WBC 5.9 5.3  NEUTROABS 2.7 2.0  HGB 15.3* 14.5  HCT 45.9 42.5  MCV 92.4 92.0  PLT 334 [id number]   Basic Metabolic Panel: Recent Labs  Lab 03/04/23 1618 03/05/23 0409  NA 140 138  K 3.8 3.4*  CL 106 107  CO2 24 22   GLUCOSE 95 106*  BUN 16 14  CREATININE 0.72 0.76  CALCIUM 9.1 8.8*  MG 2.3 2.3   Liver Function Tests: Recent Labs  Lab 03/04/23 1618  AST 24  ALT 24  ALKPHOS 63  BILITOT 0.6  PROT 8.0  ALBUMIN 4.2   CBG: Recent Labs  Lab 03/04/23 1617  GLUCAP 107*    Discharge time spent: {LESS THAN/GREATER THAN:26388} 30 minutes.  Signed: Annita Brod, MD [facility] 03/05/2023

## 2023-03-06 ENCOUNTER — Encounter (HOSPITAL_COMMUNITY): Payer: Self-pay | Admitting: Internal Medicine

## 2023-03-06 DIAGNOSIS — N390 Urinary tract infection, site not specified: Secondary | ICD-10-CM | POA: Diagnosis present

## 2023-03-06 DIAGNOSIS — I5032 Chronic diastolic (congestive) heart failure: Secondary | ICD-10-CM | POA: Diagnosis present

## 2023-03-06 DIAGNOSIS — I10 Essential (primary) hypertension: Secondary | ICD-10-CM

## 2023-03-06 DIAGNOSIS — R7989 Other specified abnormal findings of blood chemistry: Secondary | ICD-10-CM | POA: Diagnosis present

## 2023-03-06 DIAGNOSIS — E782 Mixed hyperlipidemia: Secondary | ICD-10-CM | POA: Diagnosis present

## 2023-03-06 DIAGNOSIS — E663 Overweight: Secondary | ICD-10-CM | POA: Diagnosis present

## 2023-03-06 LAB — T3, FREE: T3, Free: 2.9 pg/mL (ref 2.0–4.4)

## 2023-03-06 LAB — HEMOGLOBIN A1C
Hgb A1c MFr Bld: 5.7 % — ABNORMAL HIGH (ref 4.8–5.6)
Mean Plasma Glucose: 117 mg/dL

## 2023-03-06 NOTE — Assessment & Plan Note (Signed)
Initially on admission, patient had TSH checked which was found to be elevated at 7.  However, free T4 and free T3 were negative.  Given acute findings of urinary tract infection and elevated blood pressure, advised patient best plan would be to treat underlying infection and repeat thyroid function studies in 1 month.

## 2023-03-06 NOTE — Assessment & Plan Note (Signed)
Meets criteria BMI greater than 25 

## 2023-03-06 NOTE — Assessment & Plan Note (Signed)
Fasting lipid panel noted total cholesterol of 239 with triglycerides of 515 and unable to calculate LDL due to high triglycerides.  Patient started on Tricor.

## 2023-03-06 NOTE — Assessment & Plan Note (Signed)
Incidentally noted on echocardiogram during stroke workup.  Euvolemic.  Patient started on blood pressure with diuretic.  See below.

## 2023-03-06 NOTE — Assessment & Plan Note (Signed)
Initial urinalysis noted moderate amount of leukocytes and 21-50 with many bacteria, but repeat urinalysis noted even worse with nitrite positive.  Urine culture pending.  Patient received 1 dose of IV Rocephin during hospitalization and discharged on 3 days of p.o. Vantin.

## 2023-03-06 NOTE — Assessment & Plan Note (Signed)
Noted to have elevated blood pressure during hospitalization with systolic blood pressure in the 160s.  Started on losartan/HCTZ.

## 2023-03-07 LAB — URINE CULTURE: Culture: >=100000 — AB

## 2024-02-03 ENCOUNTER — Other Ambulatory Visit: Payer: Self-pay | Admitting: Orthopedic Surgery

## 2024-02-23 ENCOUNTER — Encounter (HOSPITAL_BASED_OUTPATIENT_CLINIC_OR_DEPARTMENT_OTHER): Payer: Self-pay | Admitting: Orthopedic Surgery

## 2024-02-25 ENCOUNTER — Encounter (HOSPITAL_BASED_OUTPATIENT_CLINIC_OR_DEPARTMENT_OTHER)
Admission: RE | Admit: 2024-02-25 | Discharge: 2024-02-25 | Disposition: A | Discharge status: Home / Self Care | Source: Ambulatory Visit | Attending: Orthopedic Surgery | Admitting: Orthopedic Surgery

## 2024-02-25 DIAGNOSIS — Z01810 Encounter for preprocedural cardiovascular examination: Secondary | ICD-10-CM | POA: Insufficient documentation

## 2024-02-25 LAB — BASIC METABOLIC PANEL
Anion gap: 9 (ref 5–15)
BUN: 9 mg/dL (ref 8–23)
CO2: 23 mmol/L (ref 22–32)
Calcium: 8.8 mg/dL — ABNORMAL LOW (ref 8.9–10.3)
Chloride: 106 mmol/L (ref 98–111)
Creatinine, Ser: 0.65 mg/dL (ref 0.44–1.00)
GFR, Estimated: >60 mL/min (ref >60)
Glucose, Bld: 94 mg/dL (ref 70–99)
Potassium: 4.1 mmol/L (ref 3.5–5.1)
Sodium: 138 mmol/L (ref 135–145)

## 2024-02-25 NOTE — Progress Notes (Signed)

## 2024-03-01 ENCOUNTER — Other Ambulatory Visit: Payer: Self-pay

## 2024-03-01 ENCOUNTER — Ambulatory Visit (HOSPITAL_BASED_OUTPATIENT_CLINIC_OR_DEPARTMENT_OTHER): Admitting: Anesthesiology

## 2024-03-01 ENCOUNTER — Encounter (HOSPITAL_BASED_OUTPATIENT_CLINIC_OR_DEPARTMENT_OTHER)
Admission: RE | Disposition: A | Discharge status: Home / Self Care | Payer: Self-pay | Source: Home / Self Care | Attending: Orthopedic Surgery

## 2024-03-01 ENCOUNTER — Encounter (HOSPITAL_BASED_OUTPATIENT_CLINIC_OR_DEPARTMENT_OTHER): Payer: Self-pay | Admitting: Orthopedic Surgery

## 2024-03-01 ENCOUNTER — Ambulatory Visit (HOSPITAL_BASED_OUTPATIENT_CLINIC_OR_DEPARTMENT_OTHER)
Admission: RE | Admit: 2024-03-01 | Discharge: 2024-03-01 | Disposition: A | Discharge status: Home / Self Care | Payer: Self-pay | Attending: Orthopedic Surgery | Admitting: Orthopedic Surgery

## 2024-03-01 DIAGNOSIS — M65332 Trigger finger, left middle finger: Secondary | ICD-10-CM | POA: Diagnosis present

## 2024-03-01 DIAGNOSIS — Z79899 Other long term (current) drug therapy: Secondary | ICD-10-CM | POA: Diagnosis not present

## 2024-03-01 DIAGNOSIS — I509 Heart failure, unspecified: Secondary | ICD-10-CM | POA: Diagnosis not present

## 2024-03-01 DIAGNOSIS — I110 Hypertensive heart disease with heart failure: Secondary | ICD-10-CM | POA: Diagnosis not present

## 2024-03-01 DIAGNOSIS — E785 Hyperlipidemia, unspecified: Secondary | ICD-10-CM | POA: Diagnosis not present

## 2024-03-01 DIAGNOSIS — K219 Gastro-esophageal reflux disease without esophagitis: Secondary | ICD-10-CM | POA: Insufficient documentation

## 2024-03-01 DIAGNOSIS — Z01818 Encounter for other preprocedural examination: Secondary | ICD-10-CM

## 2024-03-01 HISTORY — DX: Gastro-esophageal reflux disease without esophagitis: K21.9

## 2024-03-01 HISTORY — PX: TRIGGER FINGER RELEASE: SHX641

## 2024-03-01 HISTORY — DX: Hyperlipidemia, unspecified: E78.5

## 2024-03-01 SURGERY — RELEASE, A1 PULLEY, FOR TRIGGER FINGER
Anesthesia: Monitor Anesthesia Care | Site: Finger | Laterality: Left

## 2024-03-01 MED ORDER — FENTANYL CITRATE (PF) 100 MCG/2ML IJ SOLN: 25.0000 ug | INTRAMUSCULAR | Status: DC | PRN

## 2024-03-01 MED ORDER — CEFAZOLIN SODIUM-DEXTROSE 2-4 GM/100ML-% IV SOLN
INTRAVENOUS | Status: AC
  Filled 2024-03-01: qty 100

## 2024-03-01 MED ORDER — FENTANYL CITRATE (PF) 100 MCG/2ML IJ SOLN
INTRAMUSCULAR | Status: AC
  Filled 2024-03-01: qty 2

## 2024-03-01 MED ORDER — PROPOFOL 10 MG/ML IV BOLUS
INTRAVENOUS | Status: DC | PRN
Start: 2024-03-01 — End: 2024-03-01
  Administered 2024-03-01: 125 ug/kg/min via INTRAVENOUS

## 2024-03-01 MED ORDER — 0.9 % SODIUM CHLORIDE (POUR BTL) OPTIME
TOPICAL | Status: DC | PRN
  Administered 2024-03-01: 120 mL

## 2024-03-01 MED ORDER — MIDAZOLAM HCL 2 MG/2ML IJ SOLN
INTRAMUSCULAR | Status: AC
  Filled 2024-03-01: qty 2

## 2024-03-01 MED ORDER — CEFAZOLIN SODIUM-DEXTROSE 2-4 GM/100ML-% IV SOLN
2.0000 g | INTRAVENOUS | Status: AC
  Administered 2024-03-01: 2 g via INTRAVENOUS

## 2024-03-01 MED ORDER — BUPIVACAINE HCL (PF) 0.25 % IJ SOLN
INTRAMUSCULAR | Status: AC
  Filled 2024-03-01: qty 30

## 2024-03-01 MED ORDER — DEXMEDETOMIDINE HCL IN NACL 80 MCG/20ML IV SOLN
INTRAVENOUS | Status: DC | PRN
  Administered 2024-03-01: 8 ug via INTRAVENOUS

## 2024-03-01 MED ORDER — ONDANSETRON HCL 4 MG/2ML IJ SOLN: 4.0000 mg | Freq: Once | INTRAMUSCULAR | Status: DC | PRN

## 2024-03-01 MED ORDER — OXYCODONE HCL 5 MG/5ML PO SOLN: 5.0000 mg | Freq: Once | ORAL | Status: DC | PRN

## 2024-03-01 MED ORDER — OXYCODONE HCL 5 MG PO TABS: 5.0000 mg | ORAL_TABLET | Freq: Once | ORAL | Status: DC | PRN

## 2024-03-01 MED ORDER — LIDOCAINE HCL (PF) 0.5 % IJ SOLN
INTRAMUSCULAR | Status: DC | PRN
  Administered 2024-03-01: 30 mg via INTRAVENOUS

## 2024-03-01 MED ORDER — LIDOCAINE HCL (PF) 0.5 % IJ SOLN
INTRAMUSCULAR | Status: AC
  Filled 2024-03-01: qty 50

## 2024-03-01 MED ORDER — HYDROCODONE-ACETAMINOPHEN 5-325 MG PO TABS: 1.0000 | ORAL_TABLET | Freq: Four times a day (QID) | ORAL | 0 refills | Status: AC | PRN

## 2024-03-01 MED ORDER — MIDAZOLAM HCL 2 MG/2ML IJ SOLN
INTRAMUSCULAR | Status: DC | PRN
  Administered 2024-03-01: 1 mg via INTRAVENOUS

## 2024-03-01 MED ORDER — FENTANYL CITRATE (PF) 100 MCG/2ML IJ SOLN
INTRAMUSCULAR | Status: DC | PRN
  Administered 2024-03-01: 50 ug via INTRAVENOUS

## 2024-03-01 MED ORDER — LACTATED RINGERS IV SOLN: INTRAVENOUS | Status: DC

## 2024-03-01 MED ORDER — BUPIVACAINE HCL (PF) 0.25 % IJ SOLN
INTRAMUSCULAR | Status: DC | PRN
  Administered 2024-03-01: 9 mL

## 2024-03-01 SURGICAL SUPPLY — 28 items
BLADE SURG 15 STRL LF DISP TIS (BLADE) ×2 IMPLANT
BNDG COHESIVE 2X5 TAN ST LF (GAUZE/BANDAGES/DRESSINGS) ×1 IMPLANT
BNDG ESMARK 4X9 LF (GAUZE/BANDAGES/DRESSINGS) IMPLANT
CHLORAPREP W/TINT 26 (MISCELLANEOUS) ×1 IMPLANT
CORD BIPOLAR FORCEPS 12FT (ELECTRODE) ×1 IMPLANT
COVER BACK TABLE 60X90IN (DRAPES) ×1 IMPLANT
COVER MAYO STAND STRL (DRAPES) ×1 IMPLANT
CUFF TOURN SGL QUICK 18X4 (TOURNIQUET CUFF) ×1 IMPLANT
DRAPE EXTREMITY T 121X128X90 (DISPOSABLE) ×1 IMPLANT
DRAPE SURG 17X23 STRL (DRAPES) ×1 IMPLANT
GAUZE SPONGE 4X4 12PLY STRL (GAUZE/BANDAGES/DRESSINGS) ×1 IMPLANT
GAUZE XEROFORM 1X8 LF (GAUZE/BANDAGES/DRESSINGS) ×1 IMPLANT
GLOVE BIO SURGEON STRL SZ 6.5 (GLOVE) IMPLANT
GLOVE BIO SURGEON STRL SZ7.5 (GLOVE) ×1 IMPLANT
GLOVE BIOGEL PI IND STRL 7.0 (GLOVE) IMPLANT
GLOVE BIOGEL PI IND STRL 8 (GLOVE) ×1 IMPLANT
GOWN STRL REUS W/ TWL LRG LVL3 (GOWN DISPOSABLE) ×1 IMPLANT
GOWN STRL REUS W/TWL XL LVL3 (GOWN DISPOSABLE) ×1 IMPLANT
NDL HYPO 25X1 1.5 SAFETY (NEEDLE) ×1 IMPLANT
NEEDLE HYPO 25X1 1.5 SAFETY (NEEDLE) ×1 IMPLANT
NS IRRIG 1000ML POUR BTL (IV SOLUTION) ×1 IMPLANT
PACK BASIN DAY SURGERY FS (CUSTOM PROCEDURE TRAY) ×1 IMPLANT
STOCKINETTE 4X48 STRL (DRAPES) ×1 IMPLANT
SUT ETHILON 4 0 PS 2 18 (SUTURE) ×1 IMPLANT
SYR BULB EAR ULCER 3OZ GRN STR (SYRINGE) ×1 IMPLANT
SYR CONTROL 10ML LL (SYRINGE) ×1 IMPLANT
TOWEL GREEN STERILE FF (TOWEL DISPOSABLE) ×2 IMPLANT
UNDERPAD 30X36 HEAVY ABSORB (UNDERPADS AND DIAPERS) ×1 IMPLANT

## 2024-03-01 NOTE — Op Note (Signed)
 03/01/2024 [facility]  Operative Note  PREOPERATIVE DIAGNOSIS: LEFT MIDDLE TRIGGER DIGIT  POSTOPERATIVE DIAGNOSIS:  LEFT MIDDLE TRIGGER DIGIT  PROCEDURE: Procedure(s): RELEASE TRIGGER FINGER/A-1 PULLEY LEFT MIDDLE FINGER   SURGEON:  Betha Loa, MD  ASSISTANT:  none.  ANESTHESIA:  Bier block with sedation.  IV FLUIDS:  Per anesthesia flow sheet.  ESTIMATED BLOOD LOSS:  Minimal.  COMPLICATIONS:  None.  SPECIMENS:  None.  TOURNIQUET TIME:  Total Tourniquet Time Documented: Forearm (Left) - 20 minutes Total: Forearm (Left) - 20 minutes   DISPOSITION:  Stable to PACU.  LOCATION:  SURGERY CENTER  INDICATIONS: Brenda Ortega is a 68 y.o. female with triggering of the long finger.  This has been injected without lasting resolution.  She wishes to proceed with surgical trigger release.  Risks, benefits and alternatives of surgery were discussed including the risk of blood loss, infection, damage to nerves, vessels, tendons, ligaments, bone, failure of surgery, need for additional surgery, complications with wound healing, continued pain, continued triggering and need for repeat surgery.  She voiced understanding of these risks and elected to proceed.  OPERATIVE COURSE:  After being identified preoperatively by myself, the patient and I agreed upon the procedure and site of procedure.  The surgical site was marked. Surgical consent had been signed. She was given IV Ancef as preoperative antibiotic prophylaxis. She was transported to the operating room and placed on the operating room table in supine position with the Left upper extremity on an arm board. Bier block anesthesia was induced by the anesthesiologist.  The Left upper extremity was prepped and draped in normal sterile orthopedic fashion. A surgical pause was performed between surgeons, anesthesia, and operating room staff, and all were in agreement as to the patient, procedure, and site of procedure.   Tourniquet at the proximal aspect of the forearm had been inflated for the Bier block.  An incision was made at the volar aspect of the MP joint of the long finger.  This was carried into the subcutaneous tissues by spreading technique.  Bipolar electrocautery was used to obtain hemostasis.  The radial and ulnar digital nerves were protected throughout the case. The flexor sheath was identified.  The A1 pulley was identified and sharply incised.  It was released in its entirety.  The proximal 1-2 mm of the A2 pulley was vented to allow better excursion of the tendons.  The finger was placed through a range of motion and there was noted to be no catching.  The tendons were brought through the wound and any adherences released.  The wound was then copiously irrigated with sterile saline. It was closed with 4-0 nylon in a horizontal mattress fashion.  It was injected with 0.25% plain Marcaine to aid in postoperative analgesia.  It was dressed with sterile Xeroform, 4x4s, and wrapped lightly with a Coban dressing.  Tourniquet was deflated at 20 minutes.  The fingertips were pink with brisk capillary refill after deflation of the tourniquet.  The operative drapes were broken down and the patient was awoken from anesthesia safely.  She was transferred back to the stretcher and taken to the PACU in stable condition.   I will see her back in the office in 1 week for postoperative followup.  I will give her a prescription for Norco 5/325 1 tab PO q6 hours prn pain, dispense # 15.    Betha Loa, MD Electronically signed, 03/01/24

## 2024-03-01 NOTE — Anesthesia Preprocedure Evaluation (Signed)
 Anesthesia Evaluation  Patient identified by MRN, date of birth, ID band Patient awake    Reviewed: Allergy & Precautions, NPO status , Patient's Chart, lab work & pertinent test results  Airway Mallampati: II  TM Distance: >3 FB     Dental no notable dental hx. (+) Teeth Intact, Dental Advisory Given   Pulmonary neg pulmonary ROS   Pulmonary exam normal breath sounds clear to auscultation       Cardiovascular hypertension, Pt. on medications +CHF  Normal cardiovascular exam Rhythm:Regular Rate:Normal     Neuro/Psych negative neurological ROS  negative psych ROS   GI/Hepatic Neg liver ROS,GERD  Medicated,,  Endo/Other  HLD  Renal/GU negative Renal ROS  negative genitourinary   Musculoskeletal Left middle finger trigger finger   Abdominal   Peds  Hematology negative hematology ROS (+)   Anesthesia Other Findings   Reproductive/Obstetrics                              Anesthesia Physical Anesthesia Plan  ASA: 2  Anesthesia Plan: Bier Block and MAC and Bier Block-Lidocaine Only   Post-op Pain Management: Minimal or no pain anticipated   Induction: Intravenous  PONV Risk Score and Plan: 3 and Treatment may vary due to age or medical condition and Propofol infusion  Airway Management Planned: Natural Airway and Simple Face Mask  Additional Equipment: None  Intra-op Plan:   Post-operative Plan:   Informed Consent: I have reviewed the patients History and Physical, chart, labs and discussed the procedure including the risks, benefits and alternatives for the proposed anesthesia with the patient or authorized representative who has indicated his/her understanding and acceptance.     Dental advisory given  Plan Discussed with: Anesthesiologist and CRNA  Anesthesia Plan Comments:          Anesthesia Quick Evaluation

## 2024-03-01 NOTE — H&P (Signed)
 Brenda Ortega is an 68 y.o. female.   Chief Complaint: trigger digit HPI: 68 yo female with left long finger trigger digit.  This has been injected without lasting resolution.  She wishes to proceed with surgical trigger release.  A [place]mese language video interpreter was used for the visit, (Sui, [id number]).  Allergies:  Allergies  Allergen Reactions   Penicillins Itching and Rash    Past Medical History:  Diagnosis Date   Essential hypertension 03/06/2023   GERD (gastroesophageal reflux disease)    HLD (hyperlipidemia)     Past Surgical History:  Procedure Laterality Date   APPENDECTOMY  1998   LUMBAR LAMINECTOMY/DECOMPRESSION MICRODISCECTOMY N/A 10/11/2021   Procedure: Microlumbar decompression Lumbar four -five, Lumabr five-Sacral one;  Surgeon: Jene Every, MD;  Location: [facility] OR;  Service: Orthopedics;  Laterality: N/A;    Family History: History reviewed. No pertinent family history.  Social History:   reports that she has never smoked. She has never used smokeless tobacco. She reports that she does not drink alcohol and does not use drugs.  Medications: Medications Prior to Admission  Medication Sig Dispense Refill   ascorbic acid (VITAMIN C) 500 MG tablet Take 500 mg by mouth daily.     Cholecalciferol (VITAMIN D-3 PO) Take 1 tablet by mouth daily.     fenofibrate (TRICOR) 48 MG tablet Take 48 mg by mouth daily.     losartan-hydrochlorothiazide (HYZAAR) 50-12.5 MG tablet Take 1 tablet by mouth daily.     MAGNESIUM PO Take 1 tablet by mouth daily.     Multiple Vitamins-Minerals (ZINC PO) Take 1 tablet by mouth daily.     Omega-3 Fatty Acids (FISH OIL) 300 MG CAPS Take by mouth.     pantoprazole (PROTONIX) 20 MG tablet Take 20 mg by mouth in the morning.      No results found for this or any previous visit (from the past 48 hours).  No results found.    Blood pressure (!) 150/86, pulse 81, temperature (!) 97.2 F (36.2 C), temperature source Temporal, resp. rate 15,  height 5\' 2"  (1.575 m), weight 67.2 kg, SpO2 97%.  General appearance: alert, cooperative, and appears stated age Head: Normocephalic, without obvious abnormality, atraumatic Neck: supple, symmetrical, trachea midline Extremities: Intact sensation and capillary refill all digits.  +epl/fpl/io.  No wounds. Left long finger tender to palpation at volar mp joint Skin: Skin color, texture, turgor normal. No rashes or lesions Neurologic: Grossly normal Incision/Wound: none  Assessment/Plan Left long finger trigger digit.  Non operative and operative treatment options have been discussed with the patient and patient wishes to proceed with operative treatment. Risks, benefits and alternatives of surgery were discussed including risks of blood loss, infection, damage to nerves/vessels/tendons/ligament/bone, failure of surgery, need for additional surgery, complication with wound healing, stiffness, recurrence.  She voiced understanding of these risks and elected to proceed.    Betha Loa 03/01/2024, 2:25 PM

## 2024-03-01 NOTE — Anesthesia Postprocedure Evaluation (Signed)
 Anesthesia Post Note  Patient: Brenda Ortega  Procedure(s) Performed: RELEASE TRIGGER FINGER/A-1 PULLEY LEFT MIDDLE FINGER (Left: Finger)     Patient location during evaluation: PACU Anesthesia Type: MAC Level of consciousness: awake and alert and oriented Pain management: pain level controlled Vital Signs Assessment: post-procedure vital signs reviewed and stable Respiratory status: spontaneous breathing, nonlabored ventilation and respiratory function stable Cardiovascular status: stable and blood pressure returned to baseline Postop Assessment: no apparent nausea or vomiting Anesthetic complications: no   No notable events documented.  Last Vitals:  Vitals:   03/01/24 1602 03/01/24 1615  BP: 121/65 135/67  Pulse: 65 67  Resp: 15 15  Temp: (!) 36.2 C   SpO2: 99% 94%    Last Pain:  Vitals:   03/01/24 1602  TempSrc:   PainSc: 0-No pain                 Charissa Knowles A.

## 2024-03-01 NOTE — Transfer of Care (Signed)
 Immediate Anesthesia Transfer of Care Note  Patient: Brenda Ortega  Procedure(s) Performed: RELEASE TRIGGER FINGER/A-1 PULLEY LEFT MIDDLE FINGER (Left: Finger)  Patient Location: PACU  Anesthesia Type:MAC and Regional  Level of Consciousness: drowsy  Airway & Oxygen Therapy: Patient Spontanous Breathing and Patient connected to face mask oxygen  Post-op Assessment: Report given to RN and Post -op Vital signs reviewed and stable  Post vital signs: Reviewed and stable  Last Vitals:  Vitals Value Taken Time  BP 121/65 03/01/24 1602  Temp 36.2 C 03/01/24 1602  Pulse 68 03/01/24 1606  Resp 19 03/01/24 1606  SpO2 99 % 03/01/24 1606  Vitals shown include unfiled device data.  Last Pain:  Vitals:   03/01/24 1311  TempSrc: Temporal  PainSc: 7          Complications: No notable events documented.

## 2024-03-01 NOTE — Discharge Instructions (Addendum)

## 2024-03-01 NOTE — Anesthesia Procedure Notes (Signed)
 Anesthesia Regional Block: Bier block (IV Regional)   Pre-Anesthetic Checklist: , timeout performed,  Correct Patient, Correct Site, Correct Laterality,  Correct Procedure, Correct Position, site marked,  Risks and benefits discussed,  Surgical consent,  Pre-op evaluation,  At surgeon's request and post-op pain management  Laterality: Left  Prep: alcohol swabs       Needles:        Needle Gauge: 22     Additional Needles:   Procedures:,,,,, intact distal pulses, Esmarch exsanguination,  Single tourniquet utilized    Narrative:  Start time: 03/01/2024 3:38 PM End time: 03/01/2024 3:38 PM

## 2024-03-02 ENCOUNTER — Encounter (HOSPITAL_BASED_OUTPATIENT_CLINIC_OR_DEPARTMENT_OTHER): Payer: Self-pay | Admitting: Orthopedic Surgery

## 2024-05-04 ENCOUNTER — Other Ambulatory Visit: Payer: Self-pay

## 2024-05-04 ENCOUNTER — Ambulatory Visit
Admission: EM | Admit: 2024-05-04 | Discharge: 2024-05-04 | Disposition: A | Discharge status: Home / Self Care | Attending: Physician Assistant | Admitting: Physician Assistant

## 2024-05-04 DIAGNOSIS — J069 Acute upper respiratory infection, unspecified: Secondary | ICD-10-CM

## 2024-05-04 DIAGNOSIS — R051 Acute cough: Secondary | ICD-10-CM

## 2024-05-04 LAB — POC COVID19/FLU A&B COMBO
Covid Antigen, POC: NEGATIVE
Influenza A Antigen, POC: NEGATIVE
Influenza B Antigen, POC: NEGATIVE

## 2024-05-04 LAB — POCT RAPID STREP A (OFFICE): Rapid Strep A Screen: NEGATIVE

## 2024-05-04 MED ORDER — PREDNISONE 20 MG PO TABS: ORAL_TABLET | ORAL | 0 refills | Status: AC

## 2024-05-04 MED ORDER — AZITHROMYCIN 250 MG PO TABS: ORAL_TABLET | ORAL | 0 refills | Status: AC

## 2024-05-04 NOTE — ED Provider Notes (Signed)
 Brenda Ortega    CSN: [id number] Arrival date & time: 05/04/24  4098      History   Chief Complaint Chief Complaint  Patient presents with   Cough   Fever    HPI Brenda Ortega is a 68 y.o. female.   HPI Burmese interpretor services provided for exam. Interpretor: Saw [phone]  Patient reports she has been having coughing, fever, myalgias for 3 days  She reports some wheezing when she coughs but denies SOB or chest tightness  She reports some ear congestion in her ears as well   Interventions: Xyzal, OTC congestion and cough medication She reports her husband had similar symptoms last week and tested negative for flu and COVID.   Past Medical History:  Diagnosis Date   Essential hypertension 03/06/2023   GERD (gastroesophageal reflux disease)    HLD (hyperlipidemia)     Patient Active Problem List   Diagnosis Date Noted   Overweight (BMI 25.0-29.9) 03/06/2023   UTI (urinary tract infection) 03/06/2023   Elevated TSH 03/06/2023   Hypercholesterolemia with hypertriglyceridemia 03/06/2023   Essential hypertension 03/06/2023   Chronic diastolic CHF (congestive heart failure) (HCC) 03/06/2023   Dizziness And right-sided facial numbness 03/04/2023   Spinal stenosis at L4-L5 level 10/11/2021    Past Surgical History:  Procedure Laterality Date   APPENDECTOMY  1998   LUMBAR LAMINECTOMY/DECOMPRESSION MICRODISCECTOMY N/A 10/11/2021   Procedure: Microlumbar decompression Lumbar four -five, Lumabr five-Sacral one;  Surgeon: Orvan Blanch, MD;  Location: [facility] OR;  Service: Orthopedics;  Laterality: N/A;   TRIGGER FINGER RELEASE Left 03/01/2024   Procedure: RELEASE TRIGGER FINGER/A-1 PULLEY LEFT MIDDLE FINGER;  Surgeon: Brunilda Capra, MD;  Location: [facility];  Service: Orthopedics;  Laterality: Left;    OB History   No obstetric history on file.      Home Medications    Prior to Admission medications   Medication Sig Start Date End Date Taking?  Authorizing Provider  azithromycin (ZITHROMAX) 250 MG tablet Take 500mg  PO daily x1d and then 250mg  daily x4 days 05/04/24  Yes Rajohn Henery E, PA-C  predniSONE (DELTASONE) 20 MG tablet Take 60mg  PO daily x 2 days, then40mg  PO daily x 2 days, then 20mg  PO daily x 3 days 05/04/24  Yes Nykolas Bacallao E, PA-C  ascorbic acid (VITAMIN C) 500 MG tablet Take 500 mg by mouth daily.    [provider]  Cholecalciferol (VITAMIN D-3 PO) Take 1 tablet by mouth daily.    [provider]  fenofibrate  (TRICOR ) 48 MG tablet Take 48 mg by mouth daily.    [provider]  HYDROcodone -acetaminophen  (NORCO/VICODIN) 5-325 MG tablet Take 1 tablet by mouth every 6 (six) hours as needed for moderate pain (pain score 4-6). 03/01/24   Kuzma, Kevin, MD  losartan -hydrochlorothiazide (HYZAAR) 50-12.5 MG tablet Take 1 tablet by mouth daily.    [provider]  MAGNESIUM PO Take 1 tablet by mouth daily.    [provider]  Multiple Vitamins-Minerals (ZINC PO) Take 1 tablet by mouth daily.    [provider]  Omega-3 Fatty Acids (FISH OIL) 300 MG CAPS Take by mouth.    [provider]  pantoprazole  (PROTONIX ) 20 MG tablet Take 20 mg by mouth in the morning.    [provider]    Family History History reviewed. No pertinent family history.  Social History Social History   Tobacco Use   Smoking status: Never   Smokeless tobacco: Never  Vaping Use   Vaping  status: Never Used  Substance Use Topics   Alcohol use: Never   Drug use: Never     Allergies   Penicillins   Review of Systems Review of Systems  Constitutional:  Positive for chills, fatigue and fever.  HENT:  Positive for congestion. Negative for ear pain, rhinorrhea and sore throat.   Respiratory:  Positive for cough. Negative for chest tightness, shortness of breath and wheezing.   Musculoskeletal:  Positive for myalgias.     Physical Exam Triage Vital Signs ED Triage Vitals   Encounter Vitals Group     BP 05/04/24 1044 (!) 169/91     Systolic BP Percentile --      Diastolic BP Percentile --      Pulse Rate 05/04/24 1044 94     Resp 05/04/24 1044 20     Temp 05/04/24 1044 99.6 F (37.6 C)     Temp Source 05/04/24 1044 Oral     SpO2 05/04/24 1044 93 %     Weight 05/04/24 1051 148 lb 2.4 oz (67.2 kg)     Height 05/04/24 1051 5\' 2"  (1.575 m)     Head Circumference --      Peak Flow --      Pain Score 05/04/24 1049 8     Pain Loc --      Pain Education --      Exclude from Growth Chart --    No data found.  Updated Vital Signs BP (!) 169/91 (BP Location: Right Arm)   Pulse 94   Temp 99.6 F (37.6 C) (Oral)   Resp 20   Ht 5\' 2"  (1.575 m)   Wt 148 lb 2.4 oz (67.2 kg)   SpO2 93%   BMI 27.10 kg/m   Visual Acuity Right Eye Distance:   Left Eye Distance:   Bilateral Distance:    Right Eye Near:   Left Eye Near:    Bilateral Near:     Physical Exam Vitals reviewed.  Constitutional:      General: She is awake. She is not in acute distress.    Appearance: Normal appearance. She is well-developed and well-groomed. She is not ill-appearing or toxic-appearing.     Comments: Patient is a pleasant 68 year old female who appears stated age.  She is seated comfortably in exam room does not appear to be in acute distress nor does she appear severely ill  HENT:     Head: Normocephalic and atraumatic.     Right Ear: Hearing, tympanic membrane and ear canal normal.     Left Ear: Hearing, tympanic membrane and ear canal normal.     Mouth/Throat:     Lips: Pink.     Mouth: Mucous membranes are moist.     Pharynx: Oropharynx is clear. Uvula midline. No pharyngeal swelling, oropharyngeal exudate, posterior oropharyngeal erythema, uvula swelling or postnasal drip.  Cardiovascular:     Rate and Rhythm: Normal rate and regular rhythm.     Pulses: Normal pulses.          Radial pulses are 2+ on the right side and 2+ on the left side.     Heart sounds: Normal  heart sounds. No murmur heard.    No friction rub. No gallop.  Pulmonary:     Effort: Pulmonary effort is normal. No respiratory distress.     Breath sounds: Decreased air movement present. Examination of the right-lower field reveals decreased breath sounds. Examination of the left-lower field reveals decreased breath sounds. Decreased breath  sounds present. No wheezing, rhonchi or rales.     Comments: Oxygen saturation increased to 97% during pulm exam.  Musculoskeletal:     Cervical back: Normal range of motion and neck supple.  Lymphadenopathy:     Head:     Right side of head: No submental, submandibular or preauricular adenopathy.     Left side of head: No submental, submandibular or preauricular adenopathy.     Cervical:     Right cervical: No superficial cervical adenopathy.    Left cervical: No superficial cervical adenopathy.     Upper Body:     Right upper body: No supraclavicular adenopathy.     Left upper body: No supraclavicular adenopathy.  Skin:    General: Skin is warm and dry.  Neurological:     General: No focal deficit present.     Mental Status: She is alert and oriented to person, place, and time.  Psychiatric:        Mood and Affect: Mood normal.        Behavior: Behavior normal. Behavior is cooperative.      [facility] Treatments / Results  Labs (all labs ordered are listed, but only abnormal results are displayed) Labs Reviewed  POC COVID19/FLU A&B COMBO - Normal  POCT RAPID STREP A (OFFICE) - Normal    EKG   Radiology No results found.  Procedures Procedures (including critical care time)  Medications Ordered in Or[facility] Medications - No data to display  Initial Impression / Assessment and Plan / Ortega Course  I have reviewed the triage vital signs and the nursing notes.  Pertinent labs & imaging results that were available during my care of the patient were reviewed by me and considered in my medical decision making (see chart for details).       Final Clinical Impressions(s) / Ortega Diagnoses   Final diagnoses:  Upper respiratory tract infection, unspecified type  Acute cough   Patient presents today with concerns for acute cough, fever, body aches and nasal congestion for the past 3 days.  She reports that she has been taking Xyzal as well as over-the-counter congestion and cough medication.  She reports similar symptoms and her husband last week but he seemingly has recovered well.  Physical exam is overall reassuring with the exception of mildly reduced airflow.  Oxygen saturation is 93% but this did improve to 97% during pulmonary exam.  Given presentation today we will start Z-Pak as well as prednisone to assist with respiratory symptoms.  Recommend continued use of over-the-counter medications as needed for further symptomatic relief.  ED and return precautions reviewed and provided in after visit summary.  Follow-up as needed.    Discharge Instructions      Based on your described symptoms and the duration of symptoms it is likely that you have a viral upper respiratory infection (often called a "cold")  Symptoms can last for 3-10 days with lingering cough and intermittent symptoms potentially  lasting several  weeks after that.  The goal of treatment at this time is to reduce your symptoms and discomfort   You can use the following medications and measures to help yourself feel better until your body fights this off: DayQuil/NyQuil, TheraFlu, Alka-Seltzer  (these medications typically have the same active ingredients in them so you can choose whichever one you prefer and take consistently during the day and night according to the manufactures instructions.) Flonase A daily antihistamine such as Zyrtec, Claritin, Allegra per your preference.  Please choose 1  and take consistently. Increased fluids.  It is recommended that you take in at least 64 ounces of water per day when you are not sick so it is important to increase  this when you are sick and your body may be running fever. Rest Cough drops Chloraseptic throat spray to help with sore throat Nasal saline spray or nasal flushes to help with congestion and runny nose  I have sent in a script for Prednisone taper to be taken in the morning with breakfast per the instructions on the container Remember that steroids can cause sleeplessness, irritability, increased hunger and elevated glucose levels so be mindful of these side effects. They should lessen as you progress to the lower doses of the taper.  I have also sent in a medication called azithromycin which is also called in a Z-Pak.  This is an antibiotic that typically helps reduce inflammation in the lungs.  This should provide bacterial coverage as well as improve your breathing.  If your symptoms seem like they are getting worse over the next 5 to 7 days or not improving you can always follow-up here in urgent care or go to your primary care provider for further management. Go to the ER if you begin to have more serious symptoms such as shortness of breath, trouble breathing, loss of consciousness, swelling around the eyes, high fever, severe lasting headaches, vision changes or neck pain/stiffness.    ???? ???????????????????? ??????????????????????? ?????? ?????? ?????????????????? ????????????????????????????? (????????????? "??????") ???????? ????????????????? ?????????????????? ?-?? ??????????????? ??????????????? ?????????? ????????????????????? ????????? ????????????? ?????????? ???????????? ???????????? ???????????? ???????? ?????? ????????????? ??????????? ?Treatment ????????????????? ?????????????? ???????????????????? ?????? ???????? ??????? ??????????????? ????????????????? ????????????- DayQuil/NyQuil, TheraFlu, Alka-Seltzer (???????????? ?????????????? ?????? ?????????????? ???????????????????? ????????????????????? ???????????? ????????????? ?????????? ???????????????? ???????????????  ??????????????) Flonase ???????? ?????????????? ??????????? ??????? ????????? ??????? ???????? ?????????????????? ????????????????????????? ??????????? ???????????? ??????????????? ????? ??????????????? ????????? ????????? ???????  ?????? ??????????? ????????????????? ?????? ?? ?????????? ?? ????????? ????????????? ?????????????????????? ??????? ????????????????????? ????????? ????????? ??????? cough drops ??? Chloraseptic ???? ???????????????? ??????????? ?????????????????? ?????????????? ??????????????? ????????? ????????? ??????????????? ??????????? ?????????????????? Prednisone taper ??? ?????????? ???????????? ????????????????? ???????? ?????????????????? steroids ???? ?????????????????? ????????????? ?????????? ??????? ????????????? ?????????????? ???????????? ???????????????????? ????????? ?????? ??????????????????????????????? ????????????? ??????????? ????????????????? Azithromycin ??????????? ?????????? ?????????????????? ???? Z-Pak ???????? ??????????? ???? ?????????????????? ??????????????? ?????????? ???????? ?????????? ???? ???????????? ??????????? ??????????????????? ??????????????????? ??????????????????  ?????????????????? ????????????? ??????????????? ????????????? ????????? ????????????????????? ?????? ????????????????? ?????????????????? ????????? ???????????????????????????? ????????????????????????? ???????????? ??????? ???????????????? ?????????????????????????????? ????????????????????? ??????????????????? ????????????????????? ???????????????????????? ????????? ??????????????/???????????????????? ?????????????????? ???????    ED Prescriptions     Medication Sig Dispense Auth. Provider   azithromycin (ZITHROMAX) 250 MG tablet Take 500mg  PO daily x1d and then 250mg  daily x4 days 6 each Toure Edmonds E, PA-C   predniSONE (DELTASONE) 20 MG tablet Take 60mg  PO daily x 2 days, then40mg  PO daily x 2 days, then 20mg  PO daily x 3 days 13 tablet Billy Rocco E, PA-C      PDMP not reviewed  this encounter.   Simmie Camerer, Pearla Bottom, PA-C 05/04/24 1239

## 2024-05-04 NOTE — ED Triage Notes (Signed)
 Burmese interpreter used for clinical intake. Lawrence [phone].   Pt presents with complaints of cough and fevers x 2 days. Pt states she is taking OTC Xyzal for her cough and Tylenol  for her fevers. Highest temperature reported at home was 99.5 F. Pt denies shortness of breath. Does report some nasal congestion.

## 2024-05-04 NOTE — Discharge Instructions (Addendum)
 Based on your described symptoms and the duration of symptoms it is likely that you have a viral upper respiratory infection (often called a "cold")  Symptoms can last for 3-10 days with lingering cough and intermittent symptoms potentially  lasting several  weeks after that.  The goal of treatment at this time is to reduce your symptoms and discomfort   You can use the following medications and measures to help yourself feel better until your body fights this off: DayQuil/NyQuil, TheraFlu, Alka-Seltzer  (these medications typically have the same active ingredients in them so you can choose whichever one you prefer and take consistently during the day and night according to the manufactures instructions.) Flonase A daily antihistamine such as Zyrtec, Claritin, Allegra per your preference.  Please choose 1 and take consistently. Increased fluids.  It is recommended that you take in at least 64 ounces of water per day when you are not sick so it is important to increase this when you are sick and your body may be running fever. Rest Cough drops Chloraseptic throat spray to help with sore throat Nasal saline spray or nasal flushes to help with congestion and runny nose  I have sent in a script for Prednisone taper to be taken in the morning with breakfast per the instructions on the container Remember that steroids can cause sleeplessness, irritability, increased hunger and elevated glucose levels so be mindful of these side effects. They should lessen as you progress to the lower doses of the taper.  I have also sent in a medication called azithromycin which is also called in a Z-Pak.  This is an antibiotic that typically helps reduce inflammation in the lungs.  This should provide bacterial coverage as well as improve your breathing.  If your symptoms seem like they are getting worse over the next 5 to 7 days or not improving you can always follow-up here in urgent care or go to your primary care  provider for further management. Go to the ER if you begin to have more serious symptoms such as shortness of breath, trouble breathing, loss of consciousness, swelling around the eyes, high fever, severe lasting headaches, vision changes or neck pain/stiffness.    ???? ???????????????????? ??????????????????????? ?????? ?????? ?????????????????? ????????????????????????????? (????????????? "??????") ???????? ????????????????? ?????????????????? ?-?? ??????????????? ??????????????? ?????????? ????????????????????? ????????? ????????????? ?????????? ???????????? ???????????? ???????????? ???????? ?????? ????????????? ??????????? ?Treatment ????????????????? ?????????????? ???????????????????? ?????? ???????? ??????? ??????????????? ????????????????? ????????????- DayQuil/NyQuil, TheraFlu, Alka-Seltzer (???????????? ?????????????? ?????? ?????????????? ???????????????????? ????????????????????? ???????????? ????????????? ?????????? ???????????????? ??????????????? ??????????????) Flonase ???????? ?????????????? ??????????? ??????? ????????? ??????? ???????? ?????????????????? ????????????????????????? ??????????? ???????????? ??????????????? ????? ??????????????? ????????? ????????? ???????  ?????? ??????????? ????????????????? ?????? ?? ?????????? ?? ????????? ????????????? ?????????????????????? ??????? ????????????????????? ????????? ????????? ??????? cough drops ??? Chloraseptic ???? ???????????????? ??????????? ?????????????????? ?????????????? ??????????????? ????????? ????????? ??????????????? ??????????? ?????????????????? Prednisone taper ??? ?????????? ???????????? ????????????????? ???????? ?????????????????? steroids ???? ?????????????????? ????????????? ?????????? ??????? ????????????? ?????????????? ???????????? ???????????????????? ????????? ?????? ??????????????????????????????? ????????????? ??????????? ????????????????? Azithromycin ??????????? ?????????? ?????????????????? ???? Z-Pak ????????  ??????????? ???? ?????????????????? ??????????????? ?????????? ???????? ?????????? ???? ???????????? ??????????? ??????????????????? ??????????????????? ??????????????????  ?????????????????? ????????????? ??????????????? ????????????? ????????? ????????????????????? ?????? ????????????????? ?????????????????? ????????? ???????????????????????????? ????????????????????????? ???????????? ??????? ???????????????? ?????????????????????????????? ????????????????????? ??????????????????? ????????????????????? ???????????????????????? ????????? ??????????????/???????????????????? ?????????????????? ???????
